# Patient Record
Sex: Female | Born: 1995 | Race: Black or African American | Hispanic: No | Smoking: Never smoker
Health system: Southern US, Community
[De-identification: ages and names within clinical notes are randomized; demographics above are authoritative.]

## PROBLEM LIST (undated history)

## (undated) DIAGNOSIS — K219 Gastro-esophageal reflux disease without esophagitis: Secondary | ICD-10-CM

## (undated) DIAGNOSIS — I10 Essential (primary) hypertension: Secondary | ICD-10-CM

## (undated) HISTORY — PX: NO PAST SURGERIES: SHX2092

---

## 2020-02-23 ENCOUNTER — Emergency Department (HOSPITAL_COMMUNITY): Payer: Medicaid Other

## 2020-02-23 ENCOUNTER — Emergency Department (HOSPITAL_COMMUNITY)
Admission: EM | Admit: 2020-02-23 | Discharge: 2020-02-24 | Disposition: A | Discharge status: Home / Self Care | Payer: Medicaid Other | Attending: Emergency Medicine | Admitting: Emergency Medicine

## 2020-02-23 DIAGNOSIS — Z23 Encounter for immunization: Secondary | ICD-10-CM | POA: Insufficient documentation

## 2020-02-23 DIAGNOSIS — Y9289 Other specified places as the place of occurrence of the external cause: Secondary | ICD-10-CM | POA: Insufficient documentation

## 2020-02-23 DIAGNOSIS — S80212A Abrasion, left knee, initial encounter: Secondary | ICD-10-CM | POA: Insufficient documentation

## 2020-02-23 DIAGNOSIS — S20219A Contusion of unspecified front wall of thorax, initial encounter: Secondary | ICD-10-CM | POA: Insufficient documentation

## 2020-02-23 DIAGNOSIS — M549 Dorsalgia, unspecified: Secondary | ICD-10-CM

## 2020-02-23 DIAGNOSIS — Y939 Activity, unspecified: Secondary | ICD-10-CM | POA: Insufficient documentation

## 2020-02-23 DIAGNOSIS — Y998 Other external cause status: Secondary | ICD-10-CM | POA: Insufficient documentation

## 2020-02-23 DIAGNOSIS — S40812A Abrasion of left upper arm, initial encounter: Secondary | ICD-10-CM | POA: Insufficient documentation

## 2020-02-23 DIAGNOSIS — S299XXA Unspecified injury of thorax, initial encounter: Secondary | ICD-10-CM | POA: Diagnosis present

## 2020-02-23 DIAGNOSIS — R519 Headache, unspecified: Secondary | ICD-10-CM | POA: Insufficient documentation

## 2020-02-23 DIAGNOSIS — S0081XA Abrasion of other part of head, initial encounter: Secondary | ICD-10-CM | POA: Insufficient documentation

## 2020-02-23 DIAGNOSIS — S3210XA Unspecified fracture of sacrum, initial encounter for closed fracture: Secondary | ICD-10-CM | POA: Diagnosis not present

## 2020-02-23 LAB — CBC
HCT: 38.2 % (ref 36.0–46.0)
Hemoglobin: 12.2 g/dL (ref 12.0–15.0)
MCH: 26.4 pg (ref 26.0–34.0)
MCHC: 31.9 g/dL (ref 30.0–36.0)
MCV: 82.7 fL (ref 80.0–100.0)
Platelets: 269 10*3/uL (ref 150–400)
RBC: 4.62 MIL/uL (ref 3.87–5.11)
RDW: 13.4 % (ref 11.5–15.5)
WBC: 9.1 10*3/uL (ref 4.0–10.5)
nRBC: 0 % (ref 0.0–0.2)

## 2020-02-23 LAB — COMPREHENSIVE METABOLIC PANEL
ALT: 28 U/L (ref 0–44)
AST: 29 U/L (ref 15–41)
Albumin: 4.3 g/dL (ref 3.5–5.0)
Alkaline Phosphatase: 37 U/L — ABNORMAL LOW (ref 38–126)
Anion gap: 12 (ref 5–15)
BUN: 16 mg/dL (ref 6–20)
CO2: 24 mmol/L (ref 22–32)
Calcium: 9.5 mg/dL (ref 8.9–10.3)
Chloride: 103 mmol/L (ref 98–111)
Creatinine, Ser: 0.68 mg/dL (ref 0.44–1.00)
GFR calc Af Amer: >60 mL/min (ref >60)
GFR calc non Af Amer: >60 mL/min (ref >60)
Glucose, Bld: 103 mg/dL — ABNORMAL HIGH (ref 70–99)
Potassium: 3.9 mmol/L (ref 3.5–5.1)
Sodium: 139 mmol/L (ref 135–145)
Total Bilirubin: 0.9 mg/dL (ref 0.3–1.2)
Total Protein: 7.7 g/dL (ref 6.5–8.1)

## 2020-02-23 LAB — URINALYSIS, ROUTINE W REFLEX MICROSCOPIC
Bacteria, UA: NONE SEEN
Bilirubin Urine: NEGATIVE
Glucose, UA: NEGATIVE mg/dL
Ketones, ur: NEGATIVE mg/dL
Leukocytes,Ua: NEGATIVE
Nitrite: NEGATIVE
Protein, ur: NEGATIVE mg/dL
RBC / HPF: >50 RBC/hpf — ABNORMAL HIGH (ref 0–5)
Specific Gravity, Urine: 1.03 (ref 1.005–1.030)
pH: 6 (ref 5.0–8.0)

## 2020-02-23 LAB — I-STAT CHEM 8, ED
BUN: 12 mg/dL (ref 6–20)
Calcium, Ion: 1.18 mmol/L (ref 1.15–1.40)
Chloride: 104 mmol/L (ref 98–111)
Creatinine, Ser: 0.6 mg/dL (ref 0.44–1.00)
Glucose, Bld: 88 mg/dL (ref 70–99)
HCT: 36 % (ref 36.0–46.0)
Hemoglobin: 12.2 g/dL (ref 12.0–15.0)
Potassium: 4 mmol/L (ref 3.5–5.1)
Sodium: 141 mmol/L (ref 135–145)
TCO2: 25 mmol/L (ref 22–32)

## 2020-02-23 LAB — I-STAT BETA HCG BLOOD, ED (MC, WL, AP ONLY): I-stat hCG, quantitative: <5 m[IU]/mL (ref <5)

## 2020-02-23 LAB — ETHANOL: Alcohol, Ethyl (B): <10 mg/dL (ref <10)

## 2020-02-23 LAB — LACTIC ACID, PLASMA: Lactic Acid, Venous: 3.1 mmol/L (ref 0.5–1.9)

## 2020-02-23 MED ORDER — IBUPROFEN 600 MG PO TABS: 600.0000 mg | ORAL_TABLET | Freq: Four times a day (QID) | ORAL | 0 refills | Status: DC | PRN

## 2020-02-23 MED ORDER — FENTANYL CITRATE (PF) 100 MCG/2ML IJ SOLN
INTRAMUSCULAR | Status: AC | PRN
  Administered 2020-02-23: 75 ug via INTRAVENOUS

## 2020-02-23 MED ORDER — SODIUM CHLORIDE 0.9 % IV BOLUS
1000.0000 mL | Freq: Once | INTRAVENOUS | Status: AC
  Administered 2020-02-23: 1000 mL via INTRAVENOUS

## 2020-02-23 MED ORDER — TETANUS-DIPHTH-ACELL PERTUSSIS 5-2.5-18.5 LF-MCG/0.5 IM SUSP
0.5000 mL | Freq: Once | INTRAMUSCULAR | Status: AC
  Administered 2020-02-24: 0.5 mL via INTRAMUSCULAR
  Filled 2020-02-23: qty 0.5

## 2020-02-23 MED ORDER — IOHEXOL 300 MG/ML  SOLN
100.0000 mL | Freq: Once | INTRAMUSCULAR | Status: AC | PRN
  Administered 2020-02-23: 100 mL via INTRAVENOUS

## 2020-02-23 MED ORDER — CYCLOBENZAPRINE HCL 10 MG PO TABS: 10.0000 mg | ORAL_TABLET | Freq: Every day | ORAL | 0 refills | Status: AC

## 2020-02-23 MED ORDER — OXYCODONE HCL 5 MG PO TABS: 5.0000 mg | ORAL_TABLET | Freq: Four times a day (QID) | ORAL | 0 refills | Status: DC | PRN

## 2020-02-23 MED ORDER — ACETAMINOPHEN 325 MG PO TABS: 650.0000 mg | ORAL_TABLET | Freq: Four times a day (QID) | ORAL | 0 refills | Status: AC | PRN

## 2020-02-23 MED ORDER — FENTANYL CITRATE (PF) 100 MCG/2ML IJ SOLN
INTRAMUSCULAR | Status: AC
  Filled 2020-02-23: qty 2
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
## 2020-02-24 MED ORDER — HYDROCODONE-ACETAMINOPHEN 5-325 MG PO TABS
1.0000 | ORAL_TABLET | Freq: Once | ORAL | Status: AC
  Administered 2020-02-24: 1 via ORAL
  Filled 2020-02-24: qty 1

## 2020-02-24 NOTE — ED Notes (Signed)
Dr. Jena Gauss transferred to Dr. Eudelia Bunch per his request

## 2020-02-24 NOTE — ED Notes (Signed)
Patient and father verbalized understanding of DC instructions and follow up care with ortho

## 2020-03-06 ENCOUNTER — Emergency Department
Admission: EM | Admit: 2020-03-06 | Discharge: 2020-03-06 | Disposition: A | Discharge status: Home / Self Care | Payer: Medicaid Other | Attending: Emergency Medicine | Admitting: Emergency Medicine

## 2020-03-06 ENCOUNTER — Other Ambulatory Visit: Payer: Self-pay

## 2020-03-06 ENCOUNTER — Encounter: Payer: Self-pay | Admitting: Emergency Medicine

## 2020-03-06 DIAGNOSIS — S01511A Laceration without foreign body of lip, initial encounter: Secondary | ICD-10-CM | POA: Diagnosis not present

## 2020-03-06 DIAGNOSIS — S3210XD Unspecified fracture of sacrum, subsequent encounter for fracture with routine healing: Secondary | ICD-10-CM | POA: Diagnosis not present

## 2020-03-06 DIAGNOSIS — Y939 Activity, unspecified: Secondary | ICD-10-CM | POA: Diagnosis not present

## 2020-03-06 DIAGNOSIS — S42002D Fracture of unspecified part of left clavicle, subsequent encounter for fracture with routine healing: Secondary | ICD-10-CM | POA: Diagnosis not present

## 2020-03-06 DIAGNOSIS — Z79899 Other long term (current) drug therapy: Secondary | ICD-10-CM | POA: Diagnosis not present

## 2020-03-06 DIAGNOSIS — S00501A Unspecified superficial injury of lip, initial encounter: Secondary | ICD-10-CM | POA: Diagnosis present

## 2020-03-06 DIAGNOSIS — S8012XD Contusion of left lower leg, subsequent encounter: Secondary | ICD-10-CM | POA: Diagnosis not present

## 2020-03-06 DIAGNOSIS — S01512A Laceration without foreign body of oral cavity, initial encounter: Secondary | ICD-10-CM | POA: Diagnosis not present

## 2020-03-06 DIAGNOSIS — Y92410 Unspecified street and highway as the place of occurrence of the external cause: Secondary | ICD-10-CM | POA: Insufficient documentation

## 2020-03-06 DIAGNOSIS — T07XXXA Unspecified multiple injuries, initial encounter: Secondary | ICD-10-CM

## 2020-03-06 DIAGNOSIS — Y999 Unspecified external cause status: Secondary | ICD-10-CM | POA: Diagnosis not present

## 2020-03-06 NOTE — ED Notes (Signed)
See triage note - sore left side tongue from bite
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
## 2020-03-28 ENCOUNTER — Other Ambulatory Visit: Payer: Self-pay | Admitting: Otolaryngology

## 2020-03-30 LAB — SURGICAL PATHOLOGY

## 2020-06-19 ENCOUNTER — Encounter: Payer: Self-pay | Admitting: Emergency Medicine

## 2020-06-19 ENCOUNTER — Other Ambulatory Visit: Payer: Self-pay

## 2020-06-19 ENCOUNTER — Emergency Department
Admission: EM | Admit: 2020-06-19 | Discharge: 2020-06-19 | Disposition: A | Discharge status: Home / Self Care | Payer: Medicaid Other | Source: Home / Self Care | Attending: Emergency Medicine | Admitting: Emergency Medicine

## 2020-06-19 ENCOUNTER — Emergency Department: Payer: Medicaid Other

## 2020-06-19 ENCOUNTER — Emergency Department
Admission: EM | Admit: 2020-06-19 | Discharge: 2020-06-19 | Disposition: A | Discharge status: Home / Self Care | Payer: Medicaid Other | Attending: Emergency Medicine | Admitting: Emergency Medicine

## 2020-06-19 DIAGNOSIS — O200 Threatened abortion: Secondary | ICD-10-CM | POA: Insufficient documentation

## 2020-06-19 DIAGNOSIS — O26851 Spotting complicating pregnancy, first trimester: Secondary | ICD-10-CM | POA: Diagnosis present

## 2020-06-19 DIAGNOSIS — O4690 Antepartum hemorrhage, unspecified, unspecified trimester: Secondary | ICD-10-CM

## 2020-06-19 DIAGNOSIS — Z3A00 Weeks of gestation of pregnancy not specified: Secondary | ICD-10-CM | POA: Insufficient documentation

## 2020-06-19 DIAGNOSIS — O209 Hemorrhage in early pregnancy, unspecified: Secondary | ICD-10-CM

## 2020-06-19 DIAGNOSIS — Z3A01 Less than 8 weeks gestation of pregnancy: Secondary | ICD-10-CM | POA: Diagnosis not present

## 2020-06-19 LAB — ABO/RH: ABO/RH(D): AB POS

## 2020-06-19 LAB — WET PREP, GENITAL
Clue Cells Wet Prep HPF POC: NONE SEEN
Sperm: NONE SEEN
Trich, Wet Prep: NONE SEEN
Yeast Wet Prep HPF POC: NONE SEEN

## 2020-06-19 LAB — BASIC METABOLIC PANEL
Anion gap: 8 (ref 5–15)
BUN: 9 mg/dL (ref 6–20)
CO2: 27 mmol/L (ref 22–32)
Calcium: 9.4 mg/dL (ref 8.9–10.3)
Chloride: 104 mmol/L (ref 98–111)
Creatinine, Ser: 0.5 mg/dL (ref 0.44–1.00)
GFR, Estimated: >60 mL/min (ref >60)
Glucose, Bld: 98 mg/dL (ref 70–99)
Potassium: 3.6 mmol/L (ref 3.5–5.1)
Sodium: 139 mmol/L (ref 135–145)

## 2020-06-19 LAB — CBC
HCT: 32.3 % — ABNORMAL LOW (ref 36.0–46.0)
HCT: 33.3 % — ABNORMAL LOW (ref 36.0–46.0)
Hemoglobin: 11 g/dL — ABNORMAL LOW (ref 12.0–15.0)
Hemoglobin: 11.2 g/dL — ABNORMAL LOW (ref 12.0–15.0)
MCH: 27.5 pg (ref 26.0–34.0)
MCH: 27.1 pg (ref 26.0–34.0)
MCHC: 34.1 g/dL (ref 30.0–36.0)
MCHC: 33.6 g/dL (ref 30.0–36.0)
MCV: 80.8 fL (ref 80.0–100.0)
MCV: 80.6 fL (ref 80.0–100.0)
Platelets: 257 10*3/uL (ref 150–400)
Platelets: 298 10*3/uL (ref 150–400)
RBC: 4 MIL/uL (ref 3.87–5.11)
RBC: 4.13 MIL/uL (ref 3.87–5.11)
RDW: 12.7 % (ref 11.5–15.5)
RDW: 12.6 % (ref 11.5–15.5)
WBC: 5.5 10*3/uL (ref 4.0–10.5)
WBC: 7 10*3/uL (ref 4.0–10.5)
nRBC: 0 % (ref 0.0–0.2)
nRBC: 0 % (ref 0.0–0.2)

## 2020-06-19 LAB — CHLAMYDIA/NGC RT PCR (ARMC ONLY)
Chlamydia Tr: NOT DETECTED
N gonorrhoeae: NOT DETECTED

## 2020-06-19 LAB — POC URINE PREG, ED: Preg Test, Ur: POSITIVE — AB

## 2020-06-19 LAB — HCG, QUANTITATIVE, PREGNANCY: hCG, Beta Chain, Quant, S: 226 m[IU]/mL — ABNORMAL HIGH (ref <5)

## 2020-06-19 NOTE — ED Triage Notes (Signed)
Pt was seen here this am for vag bleeding, was told unsure of pregnancy as [place] stated too early and recommended repeat. Pt here for worsening bleeding.

## 2020-06-19 NOTE — Discharge Instructions (Signed)
Please follow-up as scheduled at [facility] clinic OB/GYN.  Return to the emergency department if you develop new or worsening symptoms that concern you.

## 2020-06-19 NOTE — ED Triage Notes (Addendum)
Pt via POV from home. Pt c/o vaginal bleeding since this AM, states it is bright red blood. Denies pain. Pt is pregnant but unknown of how far along she is. Just got a pregnancy test on the 16th. Pt is A&Ox4 and NAD. Unknown of her first day of last menstrual period. Pt states she did get STD, was dx and treated for Chlamydia and Trichomoniasis

## 2020-06-19 NOTE — ED Provider Notes (Signed)
[facility] Emergency Department Provider Note  ____________________________________________   Event Date/Time   First MD Initiated Contact with Patient 06/19/20 2309     (approximate)  I have reviewed the triage vital signs and the nursing notes.   HISTORY  Chief Complaint Vaginal Bleeding    HPI [name] is a 24 y.o. female G1, P0 who just found out she was pregnant and was seen earlier today for vaginal bleeding. She presents tonight because her vaginal bleeding is gotten little bit worse. She said the bite tonight she feels that it is about the same as a usual menstrual period. She is having no cramping. She denies fever/chills, sore throat, chest pain, shortness of breath, nausea, vomiting, any abdominal pain, and dysuria. She is just concerned because it is her 1st pregnancy and the ultrasound earlier tonight could not tell her if it is her early pregnancy or a miscarriage. She has follow-up scheduled at [facility] clinic OB/GYN. She says she was a scared because of the increase in bleeding. She describes it as severe nothing in particular makes it better or worse. She has had no lightheadedness nor dizziness and does not feel like she is going to pass out.         No past medical history on file.  There are no problems to display for this patient.   No past surgical history on file.  Prior to Admission medications   Medication Sig Start Date End Date Taking? Authorizing Provider  ibuprofen (ADVIL) 600 MG tablet Take 1 tablet (600 mg total) by mouth every 6 (six) hours as needed for up to 30 doses for mild pain or moderate pain. 02/23/20   Terald Sleeper, MD  oxyCODONE (ROXICODONE) 5 MG immediate release tablet Take 1 tablet (5 mg total) by mouth every 6 (six) hours as needed for up to 5 doses for severe pain. 02/23/20   Terald Sleeper, MD    Allergies Patient has no known allergies.  No family history on file.  Social  History Social History   Tobacco Use  . Smoking status: Never Smoker    Review of Systems Constitutional: No fever/chills Eyes: No visual changes. ENT: No sore throat. Cardiovascular: Denies chest pain. Respiratory: Denies shortness of breath. Gastrointestinal: No abdominal pain.  No nausea, no vomiting.  No diarrhea.  No constipation. Genitourinary: Vaginal bleeding in early pregnancy. Musculoskeletal: Negative for neck pain.  Negative for back pain. Integumentary: Negative for rash. Neurological: Negative for headaches, focal weakness or numbness.   ____________________________________________   PHYSICAL EXAM:  VITAL SIGNS: ED Triage Vitals  Enc Vitals Group     BP 06/19/20 2058 (!) 158/84     Pulse Rate 06/19/20 2058 87     Resp 06/19/20 2058 20     Temp 06/19/20 2058 98.9 F (37.2 C)     Temp Source 06/19/20 2058 Oral     SpO2 06/19/20 2058 98 %     Weight 06/19/20 2059 77.1 kg (170 lb)     Height 06/19/20 2059 1.676 m (5\' 6" )     Head Circumference --      Peak Flow --      Pain Score 06/19/20 2059 0     Pain Loc --      Pain Edu? --      Excl. in GC? --     Constitutional: Alert and oriented.  Eyes: Conjunctivae are normal.  Head: Atraumatic. Nose: No congestion/rhinnorhea. Mouth/Throat: Patient is wearing a mask.  Neck: No stridor.  No meningeal signs.   Cardiovascular: Normal rate, regular rhythm. Good peripheral circulation. Respiratory: Normal respiratory effort.  No retractions. Gastrointestinal: Soft and nontender. No distention.  Genitourinary: Deferred after discussing with the patient and her preference is to hold off on a repeat pelvic exam. Musculoskeletal: No lower extremity tenderness nor edema. No gross deformities of extremities. Neurologic:  Normal speech and language. No gross focal neurologic deficits are appreciated.  Skin:  Skin is warm, dry and intact. Psychiatric: Mood and affect are anxious but generally appropriate under the  circumstances.  ____________________________________________   LABS (all labs ordered are listed, but only abnormal results are displayed)  Labs Reviewed  CBC - Abnormal; Notable for the following components:      Result Value   Hemoglobin 11.2 (*)    HCT 33.3 (*)    All other components within normal limits   ____________________________________________  EKG  No indication for emergent EKG ____________________________________________  RADIOLOGY I, Loleta Rose, personally viewed and evaluated these images (plain radiographs) as part of my medical decision making, as well as reviewing the written report by the radiologist.  ED MD interpretation: Ultrasound was obtained earlier today and demonstrates no definite intrauterine pregnancy. No repeat imaging tonight.  Official radiology report(s): US OB LESS THAN 14 WEEKS WITH OB TRANSVAGINAL  Result Date: 06/19/2020 CLINICAL DATA:  Vaginal bleeding EXAM: OBSTETRIC <14 WK [place] AND TRANSVAGINAL OB US TECHNIQUE: Both transabdominal and transvaginal ultrasound examinations were performed for complete evaluation of the gestation as well as the maternal uterus, adnexal regions, and pelvic cul-de-sac. Transvaginal technique was performed to assess early pregnancy. COMPARISON:  None. FINDINGS: Intrauterine gestational sac: None Yolk sac:  Not Visualized. Embryo:  Not Visualized. Maternal uterus/adnexae: Nonspecific 4 mm hypoechoic area within the fundal endometrium. Adnexae are unremarkable. No free fluid. IMPRESSION: No definite intrauterine pregnancy. Nonspecific 4 mm hypoechoic area within the fundal endometrium could reflect an early or abnormal gestational sac. Follow-up ultrasound and quantitative B-HCG levels suggested. Electronically Signed   By: Guadlupe Spanish M.D.   On: 06/19/2020 13:13    ____________________________________________   PROCEDURES   Procedure(s) performed (including Critical  Care):  Procedures   ____________________________________________   INITIAL IMPRESSION / MDM / ASSESSMENT AND PLAN / ED COURSE  As part of my medical decision making, I reviewed the following data within the electronic MEDICAL RECORD NUMBER Nursing notes reviewed and incorporated, Labs reviewed , Old chart reviewed and Notes from prior ED visits   Differential diagnosis includes, but is not limited to, threatened miscarriage, incomplete miscarriage, normal bleeding from an early trimester pregnancy, ectopic pregnancy, , blighted ovum, vaginal/cervical trauma, subchorionic hemorrhage/hematoma, etc.  The patient is hemodynamically stable. Other than being anxious and concerned about the vaginal bleeding, she has no complaints or concerns. Her hemoglobin is stable from prior. The amount of bleeding she is experiencing is similar to her normal menstrual cycle.  I had a conversation with the patient and explained that this is most likely to be expected for a miscarriage. She is in no distress and does not require emergent OB/GYN consultation. After talking with me about expectations, she feels more comfortable and will follow up as an outpatient. I gave my usual and customary return precautions should she experience new or worsening symptoms. She is comfortable with the plan for discharge and deferring an additional pelvic exam at this time.            ____________________________________________  FINAL CLINICAL IMPRESSION(S) / ED DIAGNOSES  Final  diagnoses:  Threatened miscarriage     MEDICATIONS GIVEN DURING THIS VISIT:  Medications - No data to display   ED Discharge Orders    None      *Please note:  Giovana Faciane was evaluated in Emergency Department on 06/19/2020 for the symptoms described in the history of present illness. She was evaluated in the context of the global COVID-19 pandemic, which necessitated consideration that the patient might be at risk for infection  with the SARS-CoV-2 virus that causes COVID-19. Institutional protocols and algorithms that pertain to the evaluation of patients at risk for COVID-19 are in a state of rapid change based on information released by regulatory bodies including the CDC and federal and state organizations. These policies and algorithms were followed during the patient's care in the ED.  Some ED evaluations and interventions may be delayed as a result of limited staffing during and after the pandemic.*  Note:  This document was prepared using Dragon voice recognition software and may include unintentional dictation errors.   Loleta Rose, MD 06/19/20 [phone]

## 2020-06-19 NOTE — ED Provider Notes (Signed)
[facility] Emergency Department Provider Note   ____________________________________________   Event Date/Time   First MD Initiated Contact with Patient 06/19/20 1318     (approximate)  I have reviewed the triage vital signs and the nursing notes.   HISTORY  Chief Complaint Vaginal Bleeding    HPI [name] is a 24 y.o. female, G1P0 at approximately 5 weeks of pregnancy who presents to the ED complaining of vaginal bleeding.  Patient reports that she found out she was pregnant 11 days ago and has an OB/GYN appointment scheduled for this coming week.  This morning, she noticed vaginal bleeding as well as dark brownish discharge.  She denies any associated abdominal pain and has not had any nausea or vomiting.  She is unsure of her exact LMP but thinks it was about 5 weeks ago.  She has not had any dysuria or hematuria.        No past medical history on file.  There are no problems to display for this patient.   No past surgical history on file.  Prior to Admission medications   Medication Sig Start Date End Date Taking? Authorizing Provider  ibuprofen (ADVIL) 600 MG tablet Take 1 tablet (600 mg total) by mouth every 6 (six) hours as needed for up to 30 doses for mild pain or moderate pain. 02/23/20   Terald Sleeper, MD  oxyCODONE (ROXICODONE) 5 MG immediate release tablet Take 1 tablet (5 mg total) by mouth every 6 (six) hours as needed for up to 5 doses for severe pain. 02/23/20   Terald Sleeper, MD    Allergies Patient has no known allergies.  No family history on file.  Social History Social History   Tobacco Use  . Smoking status: Never Smoker    Review of Systems  Constitutional: No fever/chills Eyes: No visual changes. ENT: No sore throat. Cardiovascular: Denies chest pain. Respiratory: Denies shortness of breath. Gastrointestinal: No abdominal pain.  No nausea, no vomiting.  No diarrhea.  No  constipation. Genitourinary: Negative for dysuria.  Positive for vaginal bleeding. Musculoskeletal: Negative for back pain. Skin: Negative for rash. Neurological: Negative for headaches, focal weakness or numbness.  ____________________________________________   PHYSICAL EXAM:  VITAL SIGNS: ED Triage Vitals [06/19/20 1118]  Enc Vitals Group     BP 137/82     Pulse Rate 69     Resp 20     Temp 98.2 F (36.8 C)     Temp Source Oral     SpO2 100 %     Weight 196 lb (88.9 kg)     Height 5\' 6"  (1.676 m)     Head Circumference      Peak Flow      Pain Score 0     Pain Loc      Pain Edu?      Excl. in GC?     Constitutional: Alert and oriented. Eyes: Conjunctivae are normal. Head: Atraumatic. Nose: No congestion/rhinnorhea. Mouth/Throat: Mucous membranes are moist. Neck: Normal ROM Cardiovascular: Normal rate, regular rhythm. Grossly normal heart sounds. Respiratory: Normal respiratory effort.  No retractions. Lungs CTAB. Gastrointestinal: Soft and nontender. No distention. Genitourinary: Cervical os closed with small amount of blood in vaginal vault, no tissue noted. Musculoskeletal: No lower extremity tenderness nor edema. Neurologic:  Normal speech and language. No gross focal neurologic deficits are appreciated. Skin:  Skin is warm, dry and intact. No rash noted. Psychiatric: Mood and affect are normal. Speech and behavior are  normal.  ____________________________________________   LABS (all labs ordered are listed, but only abnormal results are displayed)  Labs Reviewed  WET PREP, GENITAL - Abnormal; Notable for the following components:      Result Value   WBC, Wet Prep HPF POC MODERATE (*)    All other components within normal limits  CBC - Abnormal; Notable for the following components:   Hemoglobin 11.0 (*)    HCT 32.3 (*)    All other components within normal limits  HCG, QUANTITATIVE, PREGNANCY - Abnormal; Notable for the following components:   hCG,  Beta Chain, Quant, S 226 (*)    All other components within normal limits  POC URINE PREG, ED - Abnormal; Notable for the following components:   Preg Test, Ur POSITIVE (*)    All other components within normal limits  CHLAMYDIA/NGC RT PCR ([facility] ONLY)  BASIC METABOLIC PANEL  ABO/RH    PROCEDURES  Procedure(s) performed (including Critical Care):  Procedures   ____________________________________________   INITIAL IMPRESSION / ASSESSMENT AND PLAN / ED COURSE       24 year old female, G1, P0 at approximately 5 weeks of pregnancy, presents to the ED with vaginal bleeding and dark brownish discharge starting this morning.  Pelvic exam shows closed cervical os with small amount of blood but no tissue noted.  She has no associate abdominal tenderness and labs are reassuring, type and Rh as well as beta hCG are pending.  Ultrasound does show no definite intrauterine pregnancy but nonspecific hypoechoic area in the fundal endometrium that could represent early or abnormal gestational sac.  Patient has OB/GYN follow-up scheduled later this week, where she may have follow-up ultrasound and beta hCG.  Patient is Rh+, no indication for RhoGam.  She is appropriate for discharge home with OB/GYN follow-up, was counseled to return to the ED for new worsening symptoms.  Patient agrees with plan.      ____________________________________________   FINAL CLINICAL IMPRESSION(S) / ED DIAGNOSES  Final diagnoses:  Vaginal bleeding in pregnancy     ED Discharge Orders    None       Note:  This document was prepared using Dragon voice recognition software and may include unintentional dictation errors.   Chesley Noon, MD 06/19/20 [phone]

## 2020-07-23 ENCOUNTER — Telehealth: Payer: Self-pay | Admitting: General Practice

## 2020-07-23 NOTE — Telephone Encounter (Signed)
Patient returning call. Advised patient return call might come through as a blocked or  unrecognizable # and to please keep phone near, patient voice understanding.

## 2020-07-23 NOTE — Telephone Encounter (Signed)
Called pt, left VM will call back number.

## 2020-07-23 NOTE — Telephone Encounter (Signed)
Pt advised:  -If you have mild symptoms that are resolving or have resolved, isolate at home for 5 days since symptoms started AND continue to wear a well-fitted mask when around others in the home and in public for 5 additional days after isolation is completed -If you have a fever and/or moderate to severe symptoms, isolate for at least 10 days since the symptoms started AND until you are fever free for at least 24 hours without the use of fever-reducing medications -If you tested positive and did not have symptoms, isolate for at least 5 days after your positive test  Use over-the-counter medications for symptoms.If you develop respiratory issues/distress, seek medical care in the Emergency Department.  If you must leave home or if you have to be around others please wear a mask. Please limit contact with immediate family members in the home, practice social distancing, frequent handwashing and clean hard surfaces touched frequently with household cleaning products. Members of your household will also need to quarantine and test.You may also be contacted by the health department for follow up.  Advised pt to call PCP or go to [facility] if phlegm is yellow, brown or green. Pt verbalized understanding.

## 2020-07-23 NOTE — Telephone Encounter (Signed)
Patient took at home COVID test results reflect positive, patient feels fatigue, no other symptoms. Patient seeking clinical advice and states her PCP office is closed today.

## 2020-09-04 DIAGNOSIS — Z3480 Encounter for supervision of other normal pregnancy, unspecified trimester: Secondary | ICD-10-CM | POA: Insufficient documentation

## 2020-09-04 DIAGNOSIS — Z3490 Encounter for supervision of normal pregnancy, unspecified, unspecified trimester: Secondary | ICD-10-CM | POA: Insufficient documentation

## 2020-09-04 HISTORY — DX: Encounter for supervision of other normal pregnancy, unspecified trimester: Z34.80

## 2020-09-04 HISTORY — DX: Encounter for supervision of normal pregnancy, unspecified, unspecified trimester: Z34.90

## 2020-09-20 ENCOUNTER — Encounter (HOSPITAL_COMMUNITY): Payer: Self-pay

## 2020-09-20 ENCOUNTER — Other Ambulatory Visit: Payer: Self-pay

## 2020-09-20 DIAGNOSIS — R079 Chest pain, unspecified: Secondary | ICD-10-CM | POA: Insufficient documentation

## 2020-09-20 DIAGNOSIS — Z3A12 12 weeks gestation of pregnancy: Secondary | ICD-10-CM | POA: Diagnosis not present

## 2020-09-20 DIAGNOSIS — O26891 Other specified pregnancy related conditions, first trimester: Secondary | ICD-10-CM | POA: Diagnosis present

## 2020-09-20 LAB — CBC
HCT: 30.2 % — ABNORMAL LOW (ref 36.0–46.0)
Hemoglobin: 10.4 g/dL — ABNORMAL LOW (ref 12.0–15.0)
MCH: 28 pg (ref 26.0–34.0)
MCHC: 34.4 g/dL (ref 30.0–36.0)
MCV: 81.2 fL (ref 80.0–100.0)
Platelets: 208 10*3/uL (ref 150–400)
RBC: 3.72 MIL/uL — ABNORMAL LOW (ref 3.87–5.11)
RDW: 15.1 % (ref 11.5–15.5)
WBC: 9.5 10*3/uL (ref 4.0–10.5)
nRBC: 0 % (ref 0.0–0.2)

## 2020-09-20 NOTE — ED Triage Notes (Signed)
Patient arrived stating she has had intermittent pain on the right side of the chest today, states her knew job has mold and wants to be checked. Declines any NVD.

## 2020-09-21 ENCOUNTER — Encounter (HOSPITAL_COMMUNITY): Payer: Self-pay | Admitting: Student

## 2020-09-21 ENCOUNTER — Emergency Department (HOSPITAL_COMMUNITY)
Admission: EM | Admit: 2020-09-21 | Discharge: 2020-09-21 | Disposition: A | Discharge status: Home / Self Care | Payer: Medicaid Other | Attending: Emergency Medicine | Admitting: Emergency Medicine

## 2020-09-21 ENCOUNTER — Emergency Department (HOSPITAL_COMMUNITY): Payer: Medicaid Other

## 2020-09-21 DIAGNOSIS — R079 Chest pain, unspecified: Secondary | ICD-10-CM

## 2020-09-21 HISTORY — DX: Gastro-esophageal reflux disease without esophagitis: K21.9

## 2020-09-21 LAB — BASIC METABOLIC PANEL
Anion gap: 8 (ref 5–15)
BUN: 10 mg/dL (ref 6–20)
CO2: 21 mmol/L — ABNORMAL LOW (ref 22–32)
Calcium: 9.3 mg/dL (ref 8.9–10.3)
Chloride: 107 mmol/L (ref 98–111)
Creatinine, Ser: 0.51 mg/dL (ref 0.44–1.00)
GFR, Estimated: >60 mL/min (ref >60)
Glucose, Bld: 105 mg/dL — ABNORMAL HIGH (ref 70–99)
Potassium: 3.8 mmol/L (ref 3.5–5.1)
Sodium: 136 mmol/L (ref 135–145)

## 2020-09-21 LAB — TROPONIN I (HIGH SENSITIVITY)
Troponin I (High Sensitivity): <2 ng/L (ref <18)
Troponin I (High Sensitivity): <2 ng/L (ref <18)

## 2020-09-21 NOTE — Discharge Instructions (Addendum)
You were seen in the emergency department today for chest pain. Your work-up in the emergency department has been overall reassuring. Your labs have been fairly normal and or similar to previous blood work you have had done. Your EKG and the enzyme we use to check your heart did not show an acute heart attack at this time. Your chest x-ray was normal.   Please take Tylenol per over-the-counter dosing to help with discomfort.  You may also use nasal saline spray to help with congestion.  We would like you to follow up closely with your primary care provider within 3 days.  Please also follow-up with your OB/GYN as scheduled.. Return to the ER immediately should you experience any new or worsening symptoms including but not limited to return of pain, worsened pain, vomiting, shortness of breath, coughing up blood, pain with deep breathing, dizziness, lightheadedness, passing out, or any other concerns that you may have.

## 2020-09-21 NOTE — ED Provider Notes (Signed)
[facility]-EMERGENCY DEPT Provider Note   CSN: [id number] Arrival date & time: 09/20/20  2254     History Chief Complaint  Patient presents with  . Chest Pain    [name] is a 25 y.o. female without significant past medical history who is approximately [redacted] weeks pregnant presents to the emergency department with complaints of chest pain today.  Patient states she has had intermittent chest pain to the right side, has had a few episodes, feels sharp in nature and lasts a couple seconds prior to resolving.  No alleviating or aggravating factors.  No change with exertion or deep breath.  No pain at present.  She is unsure if this could have just been reflux.  She also has had some mild nasal congestion.  She thinks she might have been exposed to mold today.  She denies fever, chills, ear pain, sore throat, cough, dyspnea, leg pain/swelling, hemoptysis, recent surgery/trauma, recent long travel,  personal hx of cancer, or hx of DVT/PE.   She is currently approximately [redacted] weeks pregnant, she is a follow-up appointment with her OB today, she has no complaints of abdominal pain/pelvic pain, urinary symptoms, or vaginal bleeding.    HPI     Past Medical History:  Diagnosis Date  . Acid reflux     There are no problems to display for this patient.   No past surgical history on file.   OB History    Gravida  1   Para      Term      Preterm      AB      Living        SAB      IAB      Ectopic      Multiple      Live Births              No family history on file.  Social History   Tobacco Use  . Smoking status: Never Smoker    Home Medications Prior to Admission medications   Medication Sig Start Date End Date Taking? Authorizing Provider  omeprazole (PRILOSEC) 20 MG capsule Take 40 mg by mouth every morning. 08/31/20  Yes [provider]    Allergies    Patient has no known allergies.  Review of Systems    Review of Systems  Constitutional: Negative for chills and fever.  HENT: Positive for congestion. Negative for ear pain and sore throat.   Respiratory: Negative for shortness of breath.   Cardiovascular: Positive for chest pain. Negative for leg swelling.  Gastrointestinal: Negative for abdominal pain, nausea and vomiting.  Genitourinary: Negative for dysuria and vaginal bleeding.  Neurological: Negative for syncope.  All other systems reviewed and are negative.   Physical Exam Updated Vital Signs BP 134/66   Pulse 70   Temp 98.4 F (36.9 C) (Oral)   Resp 17   Ht 5\' 6"  (1.676 m)   Wt 77.1 kg   LMP 02/16/2020 (LMP Unknown)   SpO2 100%   BMI 27.44 kg/m   Physical Exam Vitals and nursing note reviewed.  Constitutional:      General: She is not in acute distress.    Appearance: She is well-developed. She is not toxic-appearing.  HENT:     Head: Normocephalic and atraumatic.  Eyes:     General:        Right eye: No discharge.        Left eye: No discharge.  Conjunctiva/sclera: Conjunctivae normal.  Cardiovascular:     Rate and Rhythm: Normal rate and regular rhythm.     Pulses:          Radial pulses are 2+ on the right side and 2+ on the left side.  Pulmonary:     Effort: Pulmonary effort is normal. No respiratory distress.     Breath sounds: Normal breath sounds. No wheezing, rhonchi or rales.  Chest:     Chest wall: No deformity or tenderness.  Abdominal:     General: There is no distension.     Palpations: Abdomen is soft.     Tenderness: There is no abdominal tenderness.  Musculoskeletal:     Cervical back: Neck supple.     Right lower leg: No tenderness. No edema.     Left lower leg: No tenderness. No edema.  Skin:    General: Skin is warm and Zimmerman.     Findings: No rash.  Neurological:     Mental Status: She is alert.     Comments: Clear speech.   Psychiatric:        Behavior: Behavior normal.    ED Results / Procedures / Treatments   Labs (all  labs ordered are listed, but only abnormal results are displayed) Labs Reviewed  BASIC METABOLIC PANEL - Abnormal; Notable for the following components:      Result Value   CO2 21 (*)    Glucose, Bld 105 (*)    All other components within normal limits  CBC - Abnormal; Notable for the following components:   RBC 3.72 (*)    Hemoglobin 10.4 (*)    HCT 30.2 (*)    All other components within normal limits  TROPONIN I (HIGH SENSITIVITY)  TROPONIN I (HIGH SENSITIVITY)    EKG EKG Interpretation  Date/Time:  Wednesday September 20 2020 23:14:20 EDT Ventricular Rate:  93 PR Interval:  153 QRS Duration: 71 QT Interval:  368 QTC Calculation: 458 R Axis:   78 Text Interpretation: Sinus rhythm 12 Lead; Mason-Likar Confirmed by Nicanor Alcon, April ([id number]) on 09/21/2020 12:41:41 AM   Radiology DG Chest 2 View  Result Date: 09/21/2020 CLINICAL DATA:  Chest pain EXAM: CHEST - 2 VIEW COMPARISON:  02/23/2020 FINDINGS: The heart size and mediastinal contours are within normal limits. Both lungs are clear. The visualized skeletal structures are unremarkable. IMPRESSION: No active cardiopulmonary disease. Electronically Signed   By: Helyn Numbers MD   On: 09/21/2020 01:51    Procedures Procedures   Medications Ordered in ED Medications - No data to display  ED Course  I have reviewed the triage vital signs and the nursing notes.  Pertinent labs & imaging results that were available during my care of the patient were reviewed by me and considered in my medical decision making (see chart for details).    MDM Rules/Calculators/A&P                         Patient presents to the emergency department with chest pain. Patient nontoxic appearing, in no apparent distress, vitals without significant abnormality, initial elevated BP resolved. Fairly benign physical exam.   DDX: ACS, pulmonary embolism, dissection, pneumothorax, pneumonia, arrhythmia, severe anemia, MSK, GERD, anxiety.   Additional  history obtained:  Additional history obtained from chart review & nursing note review.   EKG: Sinus rhythm no STEMI  Lab Tests:  I reviewed and interpreted labs, which included:  CBC: Anemia similar to prior. BMP: No  significant electrolyte derangement. Troponin: Within normal limits/flat  Imaging Studies ordered:  I ordered imaging studies which included CXR, I independently reviewed, formal radiology impression shows:  No active cardiopulmonary disease.  ED Course:  Heart Pathway Score low risk- EKG without obvious acute ischemia, delta troponin negative, doubt ACS. Patient is low risk wells, she is noted to be pregnant, however no pleuritic component, no tachycardia/hypoxia, low suspicion for pulmonary embolism. Pain is not a tearing sensation, symmetric pulses, no widening of mediastinum on CXR, doubt dissection. Cardiac monitor reviewed, no notable arrhythmias or tachycardia. No signs of acute bacterial HEENT infection. Unclear definitive etiology, no pain at present, Overall reassuring workup, no pregnancy complaints, appears appropriate for discharge. I discussed results, treatment plan, need for follow-up, and return precautions with the patient. Provided opportunity for questions, patient confirmed understanding and is in agreement with plan.    Portions of this note were generated with Scientist, clinical (histocompatibility and immunogenetics). Dictation errors may occur despite best attempts at proofreading.  Final Clinical Impression(s) / ED Diagnoses Final diagnoses:  Chest pain, unspecified type    Rx / DC Orders ED Discharge Orders    None       Desmond Lope 09/21/20 0227    Palumbo, April, MD 09/21/20 [phone]

## 2020-09-21 NOTE — ED Notes (Signed)
Pt verbalized understanding of d/c and follow up care. Ambulatory with steady gait.  

## 2020-10-05 ENCOUNTER — Ambulatory Visit: Payer: Medicaid Other

## 2020-10-17 ENCOUNTER — Ambulatory Visit: Payer: Medicaid Other | Attending: Maternal & Fetal Medicine

## 2020-10-17 ENCOUNTER — Telehealth: Payer: Self-pay | Admitting: Obstetrics and Gynecology

## 2020-10-17 ENCOUNTER — Other Ambulatory Visit: Payer: Self-pay

## 2020-10-17 NOTE — Telephone Encounter (Signed)
Left voicemail for [name] to call back to reschedule genetic counseling visit that she missed today.  Also offered the option of telephone counseling if that is more convenient for the patient. 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  We may be reached at [phone].  Cherly Anderson, MS, CGC

## 2020-11-20 ENCOUNTER — Other Ambulatory Visit: Payer: Self-pay

## 2020-11-20 ENCOUNTER — Observation Stay
Admission: EM | Admit: 2020-11-20 | Discharge: 2020-11-20 | Disposition: A | Discharge status: Home / Self Care | Payer: Medicaid Other | Attending: Obstetrics and Gynecology | Admitting: Obstetrics and Gynecology

## 2020-11-20 DIAGNOSIS — B069 Rubella without complication: Secondary | ICD-10-CM | POA: Diagnosis not present

## 2020-11-20 DIAGNOSIS — O99012 Anemia complicating pregnancy, second trimester: Secondary | ICD-10-CM | POA: Diagnosis not present

## 2020-11-20 DIAGNOSIS — D649 Anemia, unspecified: Secondary | ICD-10-CM | POA: Diagnosis not present

## 2020-11-20 DIAGNOSIS — O132 Gestational [pregnancy-induced] hypertension without significant proteinuria, second trimester: Secondary | ICD-10-CM | POA: Diagnosis not present

## 2020-11-20 DIAGNOSIS — O09892 Supervision of other high risk pregnancies, second trimester: Secondary | ICD-10-CM | POA: Insufficient documentation

## 2020-11-20 DIAGNOSIS — Z7982 Long term (current) use of aspirin: Secondary | ICD-10-CM | POA: Insufficient documentation

## 2020-11-20 DIAGNOSIS — D573 Sickle-cell trait: Secondary | ICD-10-CM | POA: Insufficient documentation

## 2020-11-20 DIAGNOSIS — R87612 Low grade squamous intraepithelial lesion on cytologic smear of cervix (LGSIL): Secondary | ICD-10-CM | POA: Insufficient documentation

## 2020-11-20 DIAGNOSIS — Z3A20 20 weeks gestation of pregnancy: Secondary | ICD-10-CM | POA: Insufficient documentation

## 2020-11-20 DIAGNOSIS — O4692 Antepartum hemorrhage, unspecified, second trimester: Secondary | ICD-10-CM

## 2020-11-20 DIAGNOSIS — O26852 Spotting complicating pregnancy, second trimester: Principal | ICD-10-CM | POA: Insufficient documentation

## 2020-11-20 HISTORY — DX: Essential (primary) hypertension: I10

## 2020-11-20 HISTORY — DX: Antepartum hemorrhage, unspecified, second trimester: O46.92

## 2020-11-20 LAB — CHLAMYDIA/NGC RT PCR (ARMC ONLY)
Chlamydia Tr: NOT DETECTED
N gonorrhoeae: NOT DETECTED

## 2020-11-20 LAB — WET PREP, GENITAL
Clue Cells Wet Prep HPF POC: NONE SEEN
Sperm: NONE SEEN
Trich, Wet Prep: NONE SEEN
Yeast Wet Prep HPF POC: NONE SEEN

## 2020-11-20 MED ORDER — HYDROCORTISONE (PERIANAL) 2.5 % EX CREA: 1.0000 "application " | TOPICAL_CREAM | Freq: Three times a day (TID) | CUTANEOUS | 0 refills | Status: DC

## 2020-11-20 MED ORDER — WITCH HAZEL-GLYCERIN EX PADS: 1.0000 "application " | MEDICATED_PAD | CUTANEOUS | 0 refills | Status: DC | PRN

## 2020-11-20 MED ORDER — FLUCONAZOLE 50 MG PO TABS
150.0000 mg | ORAL_TABLET | Freq: Once | ORAL | Status: AC
  Administered 2020-11-20: 150 mg via ORAL
  Filled 2020-11-20: qty 1

## 2020-11-20 MED ORDER — BENZOCAINE-MENTHOL 20-0.5 % EX AERO: 1.0000 "application " | INHALATION_SPRAY | Freq: Four times a day (QID) | CUTANEOUS | 0 refills | Status: DC | PRN

## 2020-11-20 NOTE — OB Triage Note (Addendum)
Pt is a 25y/o G2P0 at [redacted]w[redacted]d with c/o bleeding of uncertain source that began while having a difficult bowel movement. Pt states recent sexual intercourse. Pt states +FM, but not able to notice any since noticed bleeding. Pt denies LOF and CTX. Monitors applied and assessing. Initial FHT 170 via doppler and external monitor.

## 2020-11-20 NOTE — Progress Notes (Signed)
Pt to be d/c home and to self care. Pt given teaching on when to seek medical attention (LOF, VB and decreased FM, etc..). Pt given instructions on sitz bath(sent home with pt). Pt given instructions about hemorrhoids and treatment plan. Pt verbalized understanding of d/c instructions.
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
## 2020-11-20 NOTE — Discharge Instructions (Signed)
Use Anusol as directed, prescription sent to pharmacy. Also use Dermoplast spray, Tucks (witch hazel pads) as needed. Sitz bath with epsom salts 3x daily for at least 10-34min per soak.   Stool softeners: take Colace twice daily to prevent constipation.

## 2020-12-08 ENCOUNTER — Observation Stay: Payer: Medicaid Other

## 2020-12-08 ENCOUNTER — Encounter: Payer: Self-pay | Admitting: Obstetrics and Gynecology

## 2020-12-08 ENCOUNTER — Other Ambulatory Visit: Payer: Self-pay

## 2020-12-08 ENCOUNTER — Observation Stay
Admission: EM | Admit: 2020-12-08 | Discharge: 2020-12-08 | Disposition: A | Discharge status: Home / Self Care | Payer: Medicaid Other

## 2020-12-08 DIAGNOSIS — O9A212 Injury, poisoning and certain other consequences of external causes complicating pregnancy, second trimester: Principal | ICD-10-CM | POA: Insufficient documentation

## 2020-12-08 DIAGNOSIS — Z7982 Long term (current) use of aspirin: Secondary | ICD-10-CM | POA: Insufficient documentation

## 2020-12-08 DIAGNOSIS — G40909 Epilepsy, unspecified, not intractable, without status epilepticus: Secondary | ICD-10-CM | POA: Insufficient documentation

## 2020-12-08 DIAGNOSIS — O10012 Pre-existing essential hypertension complicating pregnancy, second trimester: Secondary | ICD-10-CM | POA: Insufficient documentation

## 2020-12-08 DIAGNOSIS — Z3A23 23 weeks gestation of pregnancy: Secondary | ICD-10-CM | POA: Insufficient documentation

## 2020-12-08 DIAGNOSIS — R109 Unspecified abdominal pain: Secondary | ICD-10-CM

## 2020-12-08 MED ORDER — CALCIUM CARBONATE ANTACID 500 MG PO CHEW: 2.0000 | CHEWABLE_TABLET | ORAL | Status: DC | PRN

## 2020-12-08 MED ORDER — ACETAMINOPHEN 325 MG PO TABS: 650.0000 mg | ORAL_TABLET | ORAL | Status: DC | PRN

## 2020-12-08 NOTE — Progress Notes (Signed)
Pt discharged home per CNM order. Pt stable and ambulatory. Pt reports pain decreased to 2/10, describing general soreness. CNM made aware. An After Visit Summary was printed and given to the patient. Discharge education completed with patient including follow up instructions, medication list, with other d/c instructions . Pt received labor and bleeding precautions. Patient able to verbalize understanding, all questions fully answered. Patient instructed to return to ED, call 911, or call MD for any changes in condition. Pt discharged home via personal vehicle with support person.
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
## 2020-12-08 NOTE — Progress Notes (Signed)
Pt transported to Ultrasound.  

## 2021-03-09 ENCOUNTER — Encounter: Payer: Self-pay | Admitting: Obstetrics and Gynecology

## 2021-03-09 ENCOUNTER — Other Ambulatory Visit: Payer: Self-pay

## 2021-03-09 ENCOUNTER — Observation Stay
Admission: EM | Admit: 2021-03-09 | Discharge: 2021-03-10 | Disposition: A | Discharge status: Home / Self Care | Payer: Medicaid Other | Attending: Obstetrics and Gynecology | Admitting: Obstetrics and Gynecology

## 2021-03-09 DIAGNOSIS — O133 Gestational [pregnancy-induced] hypertension without significant proteinuria, third trimester: Secondary | ICD-10-CM | POA: Diagnosis present

## 2021-03-09 DIAGNOSIS — O99013 Anemia complicating pregnancy, third trimester: Secondary | ICD-10-CM | POA: Insufficient documentation

## 2021-03-09 DIAGNOSIS — D649 Anemia, unspecified: Secondary | ICD-10-CM | POA: Diagnosis not present

## 2021-03-09 DIAGNOSIS — O1493 Unspecified pre-eclampsia, third trimester: Secondary | ICD-10-CM | POA: Diagnosis present

## 2021-03-09 DIAGNOSIS — Z3A36 36 weeks gestation of pregnancy: Secondary | ICD-10-CM | POA: Diagnosis not present

## 2021-03-09 DIAGNOSIS — O163 Unspecified maternal hypertension, third trimester: Secondary | ICD-10-CM | POA: Diagnosis present
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
## 2021-03-09 NOTE — OB Triage Note (Signed)
Pt is a G2P0 and [redacted]w[redacted]d presenting to L&D with c/o "elevated BP at home." Pt denies HA, blurry vision, epigastric pain and had no clonus with +2 reflexes bilaterally. Pt confirms positive fetal movement and denies VB, LOF, ctx and pain. Monitors applied and assessing with blood pressure cycling.

## 2021-03-10 DIAGNOSIS — O133 Gestational [pregnancy-induced] hypertension without significant proteinuria, third trimester: Secondary | ICD-10-CM | POA: Diagnosis not present

## 2021-03-10 NOTE — OB Triage Note (Signed)
Discharge instructions reviewed and pts verbalized understanding. Pt has follow up visit on Monday. Red flag labor precautions reviewed. Pt stable at time of discharge and will ride home with mother.

## 2021-03-16 ENCOUNTER — Other Ambulatory Visit: Payer: Self-pay | Admitting: Obstetrics

## 2021-03-16 DIAGNOSIS — Z3490 Encounter for supervision of normal pregnancy, unspecified, unspecified trimester: Secondary | ICD-10-CM

## 2021-03-17 ENCOUNTER — Other Ambulatory Visit: Payer: Self-pay

## 2021-03-17 ENCOUNTER — Encounter
Admission: EM | Disposition: A | Discharge status: Home / Self Care | Payer: Self-pay | Source: Home / Self Care | Attending: Obstetrics

## 2021-03-17 ENCOUNTER — Encounter: Payer: Self-pay | Admitting: Obstetrics and Gynecology

## 2021-03-17 ENCOUNTER — Inpatient Hospital Stay: Payer: Medicaid Other | Admitting: Anesthesiology

## 2021-03-17 ENCOUNTER — Inpatient Hospital Stay
Admission: EM | Admit: 2021-03-17 | Discharge: 2021-03-19 | DRG: 788 | Disposition: A | Discharge status: Home / Self Care | Payer: Medicaid Other | Attending: Obstetrics | Admitting: Obstetrics

## 2021-03-17 DIAGNOSIS — Z20822 Contact with and (suspected) exposure to covid-19: Secondary | ICD-10-CM | POA: Diagnosis present

## 2021-03-17 DIAGNOSIS — Z3490 Encounter for supervision of normal pregnancy, unspecified, unspecified trimester: Secondary | ICD-10-CM

## 2021-03-17 DIAGNOSIS — D573 Sickle-cell trait: Secondary | ICD-10-CM | POA: Diagnosis present

## 2021-03-17 DIAGNOSIS — F419 Anxiety disorder, unspecified: Secondary | ICD-10-CM | POA: Diagnosis present

## 2021-03-17 DIAGNOSIS — O134 Gestational [pregnancy-induced] hypertension without significant proteinuria, complicating childbirth: Secondary | ICD-10-CM | POA: Diagnosis present

## 2021-03-17 DIAGNOSIS — O99344 Other mental disorders complicating childbirth: Secondary | ICD-10-CM | POA: Diagnosis present

## 2021-03-17 DIAGNOSIS — Z3A37 37 weeks gestation of pregnancy: Secondary | ICD-10-CM

## 2021-03-17 DIAGNOSIS — O9902 Anemia complicating childbirth: Secondary | ICD-10-CM | POA: Diagnosis present

## 2021-03-17 HISTORY — DX: Encounter for supervision of normal pregnancy, unspecified, unspecified trimester: Z34.90

## 2021-03-17 LAB — CBC
HCT: 32.7 % — ABNORMAL LOW (ref 36.0–46.0)
Hemoglobin: 11.7 g/dL — ABNORMAL LOW (ref 12.0–15.0)
MCH: 31.9 pg (ref 26.0–34.0)
MCHC: 35.8 g/dL (ref 30.0–36.0)
MCV: 89.1 fL (ref 80.0–100.0)
Platelets: 167 10*3/uL (ref 150–400)
RBC: 3.67 MIL/uL — ABNORMAL LOW (ref 3.87–5.11)
RDW: 13.6 % (ref 11.5–15.5)
WBC: 8 10*3/uL (ref 4.0–10.5)
nRBC: 0 % (ref 0.0–0.2)

## 2021-03-17 LAB — RESP PANEL BY RT-PCR (FLU A&B, COVID) ARPGX2
Influenza A by PCR: NEGATIVE
Influenza B by PCR: NEGATIVE
SARS Coronavirus 2 by RT PCR: NEGATIVE

## 2021-03-17 LAB — COMPREHENSIVE METABOLIC PANEL
ALT: 12 U/L (ref 0–44)
AST: 16 U/L (ref 15–41)
Albumin: 3.2 g/dL — ABNORMAL LOW (ref 3.5–5.0)
Alkaline Phosphatase: 76 U/L (ref 38–126)
Anion gap: 7 (ref 5–15)
BUN: 5 mg/dL — ABNORMAL LOW (ref 6–20)
CO2: 25 mmol/L (ref 22–32)
Calcium: 8.7 mg/dL — ABNORMAL LOW (ref 8.9–10.3)
Chloride: 103 mmol/L (ref 98–111)
Creatinine, Ser: 0.55 mg/dL (ref 0.44–1.00)
GFR, Estimated: >60 mL/min (ref >60)
Glucose, Bld: 82 mg/dL (ref 70–99)
Potassium: 3.4 mmol/L — ABNORMAL LOW (ref 3.5–5.1)
Sodium: 135 mmol/L (ref 135–145)
Total Bilirubin: 0.6 mg/dL (ref 0.3–1.2)
Total Protein: 6.9 g/dL (ref 6.5–8.1)

## 2021-03-17 LAB — TYPE AND SCREEN
ABO/RH(D): AB POS
Antibody Screen: NEGATIVE

## 2021-03-17 LAB — PROTEIN / CREATININE RATIO, URINE
Creatinine, Urine: 106 mg/dL
Protein Creatinine Ratio: 0.09 mg/mg{Cre} (ref 0.00–0.15)
Total Protein, Urine: 10 mg/dL

## 2021-03-17 SURGERY — Surgical Case
Anesthesia: Epidural

## 2021-03-17 MED ORDER — DEXAMETHASONE SODIUM PHOSPHATE 10 MG/ML IJ SOLN
INTRAMUSCULAR | Status: DC | PRN
  Administered 2021-03-17: 10 mg via INTRAVENOUS

## 2021-03-17 MED ORDER — FERROUS SULFATE 325 (65 FE) MG PO TABS
325.0000 mg | ORAL_TABLET | Freq: Two times a day (BID) | ORAL | Status: DC
  Administered 2021-03-18 – 2021-03-19 (×3): 325 mg via ORAL
  Filled 2021-03-17 (×3): qty 1

## 2021-03-17 MED ORDER — LIDOCAINE HCL (PF) 2 % IJ SOLN
INTRAMUSCULAR | Status: DC | PRN
  Administered 2021-03-17: 15 mg via INTRADERMAL

## 2021-03-17 MED ORDER — EPHEDRINE 5 MG/ML INJ: 10.0000 mg | INTRAVENOUS | Status: DC | PRN

## 2021-03-17 MED ORDER — FENTANYL-BUPIVACAINE-NACL 0.5-0.125-0.9 MG/250ML-% EP SOLN: 12.0000 mL/h | EPIDURAL | Status: DC | PRN

## 2021-03-17 MED ORDER — MISOPROSTOL 200 MCG PO TABS
ORAL_TABLET | ORAL | Status: AC
  Filled 2021-03-17: qty 4

## 2021-03-17 MED ORDER — DIPHENHYDRAMINE HCL 25 MG PO CAPS: 25.0000 mg | ORAL_CAPSULE | Freq: Four times a day (QID) | ORAL | Status: DC | PRN

## 2021-03-17 MED ORDER — MENTHOL 3 MG MT LOZG
1.0000 | LOZENGE | OROMUCOSAL | Status: DC | PRN
  Filled 2021-03-17: qty 9

## 2021-03-17 MED ORDER — KETOROLAC TROMETHAMINE 30 MG/ML IJ SOLN
INTRAMUSCULAR | Status: DC | PRN
  Administered 2021-03-17: 30 mg via INTRAVENOUS

## 2021-03-17 MED ORDER — OXYTOCIN-SODIUM CHLORIDE 30-0.9 UT/500ML-% IV SOLN
INTRAVENOUS | Status: AC
  Filled 2021-03-17: qty 500

## 2021-03-17 MED ORDER — OXYTOCIN-SODIUM CHLORIDE 30-0.9 UT/500ML-% IV SOLN
1.0000 m[IU]/min | INTRAVENOUS | Status: DC
Start: 2021-03-17 — End: 2021-03-17
  Administered 2021-03-17: 2 m[IU]/min via INTRAVENOUS
  Filled 2021-03-17: qty 500

## 2021-03-17 MED ORDER — OXYTOCIN-SODIUM CHLORIDE 30-0.9 UT/500ML-% IV SOLN
2.5000 [IU]/h | INTRAVENOUS | Status: DC
  Administered 2021-03-17: 50 [IU]/h via INTRAVENOUS

## 2021-03-17 MED ORDER — LIDOCAINE HCL (PF) 1 % IJ SOLN
INTRAMUSCULAR | Status: AC
  Filled 2021-03-17: qty 30

## 2021-03-17 MED ORDER — MORPHINE SULFATE (PF) 10 MG/ML IV SOLN
INTRAVENOUS | Status: DC | PRN
  Administered 2021-03-17 (×5): .5 mg via BUCCAL

## 2021-03-17 MED ORDER — CEFAZOLIN SODIUM-DEXTROSE 2-4 GM/100ML-% IV SOLN
2.0000 g | INTRAVENOUS | Status: AC
  Administered 2021-03-17: 2 g via INTRAVENOUS

## 2021-03-17 MED ORDER — MEASLES, MUMPS & RUBELLA VAC IJ SOLR
0.5000 mL | Freq: Once | INTRAMUSCULAR | Status: DC
  Filled 2021-03-17: qty 0.5

## 2021-03-17 MED ORDER — TETANUS-DIPHTH-ACELL PERTUSSIS 5-2.5-18.5 LF-MCG/0.5 IM SUSY
0.5000 mL | PREFILLED_SYRINGE | Freq: Once | INTRAMUSCULAR | Status: DC
  Filled 2021-03-17: qty 0.5

## 2021-03-17 MED ORDER — SOD CITRATE-CITRIC ACID 500-334 MG/5ML PO SOLN: 30.0000 mL | ORAL | Status: DC | PRN

## 2021-03-17 MED ORDER — GABAPENTIN 300 MG PO CAPS
300.0000 mg | ORAL_CAPSULE | Freq: Every day | ORAL | Status: DC
  Administered 2021-03-17 – 2021-03-18 (×2): 300 mg via ORAL
  Filled 2021-03-17 (×2): qty 1

## 2021-03-17 MED ORDER — OXYTOCIN-SODIUM CHLORIDE 30-0.9 UT/500ML-% IV SOLN
2.5000 [IU]/h | INTRAVENOUS | Status: AC
  Administered 2021-03-17 – 2021-03-18 (×2): 2.5 [IU]/h via INTRAVENOUS
  Filled 2021-03-17: qty 500

## 2021-03-17 MED ORDER — PHENYLEPHRINE HCL-NACL 20-0.9 MG/250ML-% IV SOLN
INTRAVENOUS | Status: DC | PRN
  Administered 2021-03-17: 10 ug/min via INTRAVENOUS

## 2021-03-17 MED ORDER — OXYTOCIN 10 UNIT/ML IJ SOLN
INTRAMUSCULAR | Status: AC
  Filled 2021-03-17: qty 2

## 2021-03-17 MED ORDER — OXYTOCIN BOLUS FROM INFUSION: 333.0000 mL | Freq: Once | INTRAVENOUS | Status: DC

## 2021-03-17 MED ORDER — SOD CITRATE-CITRIC ACID 500-334 MG/5ML PO SOLN: 30.0000 mL | ORAL | Status: DC

## 2021-03-17 MED ORDER — BUPIVACAINE HCL (PF) 0.5 % IJ SOLN
INTRAMUSCULAR | Status: AC
  Filled 2021-03-17: qty 30

## 2021-03-17 MED ORDER — LABETALOL HCL 5 MG/ML IV SOLN: 40.0000 mg | INTRAVENOUS | Status: DC | PRN

## 2021-03-17 MED ORDER — OXYCODONE HCL 5 MG PO TABS
5.0000 mg | ORAL_TABLET | ORAL | Status: DC | PRN
  Administered 2021-03-18: 5 mg via ORAL
  Administered 2021-03-19 (×2): 10 mg via ORAL
  Filled 2021-03-17: qty 2
  Filled 2021-03-17: qty 1
  Filled 2021-03-17: qty 2

## 2021-03-17 MED ORDER — BUPIVACAINE HCL (PF) 0.25 % IJ SOLN
INTRAMUSCULAR | Status: DC | PRN
  Administered 2021-03-17 (×2): 6 mL via EPIDURAL

## 2021-03-17 MED ORDER — WITCH HAZEL-GLYCERIN EX PADS: 1.0000 "application " | MEDICATED_PAD | CUTANEOUS | Status: DC | PRN

## 2021-03-17 MED ORDER — LACTATED RINGERS IV SOLN: INTRAVENOUS | Status: DC | PRN

## 2021-03-17 MED ORDER — ONDANSETRON HCL 4 MG/2ML IJ SOLN
INTRAMUSCULAR | Status: AC
  Filled 2021-03-17: qty 2

## 2021-03-17 MED ORDER — HYDRALAZINE HCL 20 MG/ML IJ SOLN: 10.0000 mg | INTRAMUSCULAR | Status: DC | PRN

## 2021-03-17 MED ORDER — ONDANSETRON HCL 4 MG/2ML IJ SOLN
4.0000 mg | Freq: Four times a day (QID) | INTRAMUSCULAR | Status: DC | PRN
  Administered 2021-03-17: 4 mg via INTRAVENOUS

## 2021-03-17 MED ORDER — AMMONIA AROMATIC IN INHA
RESPIRATORY_TRACT | Status: AC
  Filled 2021-03-17: qty 10

## 2021-03-17 MED ORDER — PHENYLEPHRINE 40 MCG/ML (10ML) SYRINGE FOR IV PUSH (FOR BLOOD PRESSURE SUPPORT)
80.0000 ug | PREFILLED_SYRINGE | INTRAVENOUS | Status: DC | PRN
Start: 2021-03-17 — End: 2021-03-17

## 2021-03-17 MED ORDER — PHENYLEPHRINE HCL (PRESSORS) 10 MG/ML IV SOLN
INTRAVENOUS | Status: DC | PRN
  Administered 2021-03-17: 160 ug via INTRAVENOUS

## 2021-03-17 MED ORDER — BUTORPHANOL TARTRATE 1 MG/ML IJ SOLN: 1.0000 mg | INTRAMUSCULAR | Status: DC | PRN

## 2021-03-17 MED ORDER — BUPIVACAINE LIPOSOME 1.3 % IJ SUSP
INTRAMUSCULAR | Status: AC
  Filled 2021-03-17: qty 20

## 2021-03-17 MED ORDER — LIDOCAINE HCL (PF) 1 % IJ SOLN: 30.0000 mL | INTRAMUSCULAR | Status: DC | PRN

## 2021-03-17 MED ORDER — FENTANYL-BUPIVACAINE-NACL 0.5-0.125-0.9 MG/250ML-% EP SOLN
EPIDURAL | Status: AC
  Filled 2021-03-17: qty 250

## 2021-03-17 MED ORDER — LIDOCAINE HCL (PF) 1 % IJ SOLN
INTRAMUSCULAR | Status: DC | PRN
  Administered 2021-03-17: 3 mL

## 2021-03-17 MED ORDER — SIMETHICONE 80 MG PO CHEW: 80.0000 mg | CHEWABLE_TABLET | ORAL | Status: DC | PRN

## 2021-03-17 MED ORDER — LACTATED RINGERS IV SOLN: INTRAVENOUS | Status: DC

## 2021-03-17 MED ORDER — ACETAMINOPHEN 500 MG PO TABS
1000.0000 mg | ORAL_TABLET | Freq: Four times a day (QID) | ORAL | Status: DC
  Administered 2021-03-17 – 2021-03-19 (×6): 1000 mg via ORAL
  Filled 2021-03-17 (×7): qty 2

## 2021-03-17 MED ORDER — KETOROLAC TROMETHAMINE 30 MG/ML IJ SOLN
30.0000 mg | Freq: Four times a day (QID) | INTRAMUSCULAR | Status: AC
  Administered 2021-03-17 – 2021-03-18 (×4): 30 mg via INTRAVENOUS
  Filled 2021-03-17 (×4): qty 1

## 2021-03-17 MED ORDER — SENNOSIDES-DOCUSATE SODIUM 8.6-50 MG PO TABS
2.0000 | ORAL_TABLET | ORAL | Status: DC
  Administered 2021-03-17 – 2021-03-18 (×2): 2 via ORAL
  Filled 2021-03-17 (×2): qty 2

## 2021-03-17 MED ORDER — SIMETHICONE 80 MG PO CHEW
80.0000 mg | CHEWABLE_TABLET | Freq: Three times a day (TID) | ORAL | Status: DC
  Administered 2021-03-18 – 2021-03-19 (×5): 80 mg via ORAL
  Filled 2021-03-17 (×5): qty 1

## 2021-03-17 MED ORDER — DEXAMETHASONE SODIUM PHOSPHATE 10 MG/ML IJ SOLN
INTRAMUSCULAR | Status: AC
  Filled 2021-03-17: qty 1

## 2021-03-17 MED ORDER — SOD CITRATE-CITRIC ACID 500-334 MG/5ML PO SOLN
ORAL | Status: AC
  Administered 2021-03-17: 30 mL via ORAL
  Filled 2021-03-17: qty 15

## 2021-03-17 MED ORDER — SODIUM CHLORIDE (PF) 0.9 % IJ SOLN
INTRAMUSCULAR | Status: AC
  Filled 2021-03-17: qty 50

## 2021-03-17 MED ORDER — LACTATED RINGERS IV SOLN: 500.0000 mL | Freq: Once | INTRAVENOUS | Status: DC

## 2021-03-17 MED ORDER — 0.9 % SODIUM CHLORIDE (POUR BTL) OPTIME
TOPICAL | Status: DC | PRN
  Administered 2021-03-17: 500 mL

## 2021-03-17 MED ORDER — BISACODYL 10 MG RE SUPP
10.0000 mg | Freq: Every day | RECTAL | Status: DC | PRN
  Filled 2021-03-17: qty 1

## 2021-03-17 MED ORDER — DIBUCAINE (PERIANAL) 1 % EX OINT: 1.0000 "application " | TOPICAL_OINTMENT | CUTANEOUS | Status: DC | PRN

## 2021-03-17 MED ORDER — LABETALOL HCL 5 MG/ML IV SOLN: 20.0000 mg | INTRAVENOUS | Status: DC | PRN

## 2021-03-17 MED ORDER — PANTOPRAZOLE SODIUM 40 MG PO TBEC
40.0000 mg | DELAYED_RELEASE_TABLET | Freq: Every day | ORAL | Status: DC
  Administered 2021-03-18 – 2021-03-19 (×2): 40 mg via ORAL
  Filled 2021-03-17 (×3): qty 1

## 2021-03-17 MED ORDER — SODIUM CHLORIDE 0.9 % IV SOLN
INTRAVENOUS | Status: DC | PRN
  Administered 2021-03-17: 100 mL

## 2021-03-17 MED ORDER — MISOPROSTOL 25 MCG QUARTER TABLET
25.0000 ug | ORAL_TABLET | ORAL | Status: DC | PRN
  Administered 2021-03-17: 25 ug via VAGINAL
  Filled 2021-03-17: qty 1

## 2021-03-17 MED ORDER — LACTATED RINGERS IV SOLN
500.0000 mL | INTRAVENOUS | Status: DC | PRN
  Administered 2021-03-17: 500 mL via INTRAVENOUS

## 2021-03-17 MED ORDER — HYDROCORTISONE (PERIANAL) 2.5 % EX CREA
1.0000 "application " | TOPICAL_CREAM | Freq: Three times a day (TID) | CUTANEOUS | Status: DC
  Administered 2021-03-18: 1 via RECTAL
  Filled 2021-03-17 (×2): qty 28.35

## 2021-03-17 MED ORDER — PHENYLEPHRINE 40 MCG/ML (10ML) SYRINGE FOR IV PUSH (FOR BLOOD PRESSURE SUPPORT): 80.0000 ug | PREFILLED_SYRINGE | INTRAVENOUS | Status: DC | PRN

## 2021-03-17 MED ORDER — MORPHINE SULFATE (PF) 0.5 MG/ML IJ SOLN
INTRAMUSCULAR | Status: AC
  Filled 2021-03-17: qty 10

## 2021-03-17 MED ORDER — ACETAMINOPHEN 325 MG PO TABS: 650.0000 mg | ORAL_TABLET | ORAL | Status: DC | PRN

## 2021-03-17 MED ORDER — IBUPROFEN 600 MG PO TABS
600.0000 mg | ORAL_TABLET | Freq: Four times a day (QID) | ORAL | Status: DC
  Administered 2021-03-19 (×3): 600 mg via ORAL
  Filled 2021-03-17 (×4): qty 1

## 2021-03-17 MED ORDER — FLEET ENEMA 7-19 GM/118ML RE ENEM: 1.0000 | ENEMA | Freq: Every day | RECTAL | Status: DC | PRN

## 2021-03-17 MED ORDER — LIDOCAINE-EPINEPHRINE (PF) 1.5 %-1:200000 IJ SOLN
INTRAMUSCULAR | Status: DC | PRN
  Administered 2021-03-17: 3 mL via PERINEURAL

## 2021-03-17 MED ORDER — SODIUM CHLORIDE 0.9 % IV SOLN
INTRAVENOUS | Status: AC
  Filled 2021-03-17: qty 500

## 2021-03-17 MED ORDER — PRENATAL MULTIVITAMIN CH
1.0000 | ORAL_TABLET | Freq: Every day | ORAL | Status: DC
  Administered 2021-03-18 – 2021-03-19 (×2): 1 via ORAL
  Filled 2021-03-17 (×2): qty 1

## 2021-03-17 MED ORDER — CEFAZOLIN SODIUM-DEXTROSE 2-4 GM/100ML-% IV SOLN
INTRAVENOUS | Status: AC
  Filled 2021-03-17: qty 100

## 2021-03-17 MED ORDER — COCONUT OIL OIL
1.0000 "application " | TOPICAL_OIL | Status: DC | PRN
  Filled 2021-03-17 (×2): qty 120

## 2021-03-17 MED ORDER — SODIUM CHLORIDE 0.9 % IV SOLN
500.0000 mg | INTRAVENOUS | Status: AC
  Administered 2021-03-17: 500 mg via INTRAVENOUS

## 2021-03-17 MED ORDER — TERBUTALINE SULFATE 1 MG/ML IJ SOLN
0.2500 mg | Freq: Once | INTRAMUSCULAR | Status: AC | PRN
Start: 2021-03-17 — End: 2021-03-17
  Administered 2021-03-17: 0.25 mg via SUBCUTANEOUS
  Filled 2021-03-17: qty 1

## 2021-03-17 MED ORDER — LABETALOL HCL 5 MG/ML IV SOLN: 80.0000 mg | INTRAVENOUS | Status: DC | PRN

## 2021-03-17 MED ORDER — DIPHENHYDRAMINE HCL 50 MG/ML IJ SOLN: 12.5000 mg | INTRAMUSCULAR | Status: DC | PRN

## 2021-03-17 SURGICAL SUPPLY — 30 items
CHLORAPREP W/TINT 26 (MISCELLANEOUS) ×2 IMPLANT
DRESSING TELFA 8X3 (GAUZE/BANDAGES/DRESSINGS) ×2 IMPLANT
DRSG TELFA 3X8 NADH (GAUZE/BANDAGES/DRESSINGS) ×2 IMPLANT
ELECT REM PT RETURN 9FT ADLT (ELECTROSURGICAL) ×2
ELECTRODE REM PT RTRN 9FT ADLT (ELECTROSURGICAL) ×1 IMPLANT
GAUZE SPONGE 4X4 12PLY STRL (GAUZE/BANDAGES/DRESSINGS) ×2 IMPLANT
GOWN STRL REUS W/ TWL LRG LVL3 (GOWN DISPOSABLE) ×3 IMPLANT
GOWN STRL REUS W/TWL LRG LVL3 (GOWN DISPOSABLE) ×3
MANIFOLD NEPTUNE II (INSTRUMENTS) ×2 IMPLANT
MAT PREVALON FULL STRYKER (MISCELLANEOUS) ×2 IMPLANT
NEEDLE HYPO 25GX1X1/2 BEV (NEEDLE) ×2 IMPLANT
NS IRRIG 1000ML POUR BTL (IV SOLUTION) ×2 IMPLANT
PACK C SECTION AR (MISCELLANEOUS) ×2 IMPLANT
PAD ABD 5X9 TENDERSORB (GAUZE/BANDAGES/DRESSINGS) ×2 IMPLANT
PAD OB MATERNITY 4.3X12.25 (PERSONAL CARE ITEMS) ×2 IMPLANT
PAD PREP 24X41 OB/GYN DISP (PERSONAL CARE ITEMS) ×2 IMPLANT
PENCIL SMOKE EVACUATOR (MISCELLANEOUS) ×2 IMPLANT
SCRUB EXIDINE 4% CHG 4OZ (MISCELLANEOUS) ×2 IMPLANT
STAPLER INSORB 30 2030 C-SECTI (MISCELLANEOUS) ×2 IMPLANT
SUT MNCRL 4-0 (SUTURE) ×1
SUT MNCRL 4-0 27XMFL (SUTURE) ×1
SUT VIC AB 0 CT1 36 (SUTURE) ×4 IMPLANT
SUT VIC AB 0 CTX 36 (SUTURE) ×2
SUT VIC AB 0 CTX36XBRD ANBCTRL (SUTURE) ×2 IMPLANT
SUT VIC AB 2-0 SH 27 (SUTURE) ×2
SUT VIC AB 2-0 SH 27XBRD (SUTURE) ×2 IMPLANT
SUTURE MNCRL 4-0 27XMF (SUTURE) ×1 IMPLANT
SYR 30ML LL (SYRINGE) ×4 IMPLANT
TAPE PAPER 2X10 WHT MICROPORE (GAUZE/BANDAGES/DRESSINGS) ×2 IMPLANT
WATER STERILE IRR 500ML POUR (IV SOLUTION) ×2 IMPLANT

## 2021-03-17 NOTE — Anesthesia Preprocedure Evaluation (Addendum)
Anesthesia Evaluation  Patient identified by MRN, date of birth, ID band Patient awake    Reviewed: Allergy & Precautions, NPO status , Patient's Chart, lab work & pertinent test results  Airway Mallampati: II  TM Distance: >3 FB Neck ROM: Full    Dental  (+) Teeth Intact   Pulmonary neg pulmonary ROS,           Cardiovascular hypertension,  Rhythm:Regular     Neuro/Psych Seizures -,     GI/Hepatic GERD  ,  Endo/Other  negative endocrine ROS  Renal/GU      Musculoskeletal   Abdominal   Peds  Hematology negative hematology ROS (+)   Anesthesia Other Findings   Reproductive/Obstetrics (+) Pregnancy                            Anesthesia Physical Anesthesia Plan  ASA: 3 and emergent  Anesthesia Plan: Epidural   Post-op Pain Management:    Induction:   PONV Risk Score and Plan:   Airway Management Planned:   Additional Equipment:   Intra-op Plan:   Post-operative Plan:   Informed Consent: I have reviewed the patients History and Physical, chart, labs and discussed the procedure including the risks, benefits and alternatives for the proposed anesthesia with the patient or authorized representative who has indicated his/her understanding and acceptance.       Plan Discussed with:   Anesthesia Plan Comments: (OSA score 1 Epidural working well. Will use for C/S. Discussed with patient. )      Anesthesia Quick Evaluation
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
## 2021-03-17 NOTE — Anesthesia Procedure Notes (Signed)
Epidural Patient location during procedure: OB Start time: 03/17/2021 4:22 PM  Staffing Anesthesiologist: Johny Drilling, MD Performed: anesthesiologist   Preanesthetic Checklist Completed: patient identified, IV checked, site marked, risks and benefits discussed, surgical consent, monitors and equipment checked, pre-op evaluation and timeout performed  Epidural Patient position: sitting Prep: ChloraPrep Patient monitoring: heart rate, continuous pulse ox and blood pressure Approach: midline Location: L3-L4 Injection technique: LOR saline  Needle:  Needle type: Tuohy  Needle gauge: 17 G Needle length: 9 cm Needle insertion depth: 7 cm Catheter type: closed end flexible Catheter size: 20 Guage Catheter at skin depth: 13 cm Test dose: negative and 1.5% lidocaine with Epi 1:200 K  Additional Notes Bupiv 6 ml via needle and 6 ml via catheter

## 2021-03-17 NOTE — Progress Notes (Signed)
G2P0010 at [redacted]w[redacted]d iol for gHTN, with cord presenting in the bulging bag of water. We have turned off pit and given terb. Pt feels overwhelmed but was consented for the c/s as below. Have called urgent section.   The risks of cesarean section discussed with the patient included but were not limited to: bleeding which may require transfusion or reoperation; infection which may require antibiotics; injury to bowel, bladder, ureters or other surrounding organs; injury to the fetus; need for additional procedures including hysterectomy in the event of a life-threatening hemorrhage; placental abnormalities wth subsequent pregnancies, incisional problems, thromboembolic phenomenon and other postoperative/anesthesia complications. The patient concurred with the proposed plan, giving informed written consent for the procedure.   Patient has been NPO and she will remain NPO for procedure. Anesthesia and OR aware. Preoperative prophylactic antibiotics and SCDs ordered on call to the OR.  To OR when ready.

## 2021-03-17 NOTE — Anesthesia Postprocedure Evaluation (Signed)
Anesthesia Post Note  Patient: [name]  Procedure(s) Performed: CESAREAN SECTION  Patient location during evaluation: PACU Anesthesia Type: Epidural Level of consciousness: awake, awake and alert, oriented and patient cooperative Pain management: pain level controlled Vital Signs Assessment: post-procedure vital signs reviewed and stable Respiratory status: spontaneous breathing Cardiovascular status: blood pressure returned to baseline and stable Anesthetic complications: no   No notable events documented.   Last Vitals:  Vitals:   03/17/21 1840 03/17/21 1845  BP:    Pulse:    Resp:    Temp:    SpO2: 99% 99%    Last Pain:  Vitals:   03/17/21 1700  TempSrc:   PainSc: 0-No pain                 Bethann Qualley

## 2021-03-17 NOTE — Progress Notes (Signed)
Labor Progress Note  [name] is a 25 y.o. G2P0010 at [redacted]w[redacted]d by ultrasound admitted for induction of labor due to [facility]  Subjective: resting in bed with grandmother and sister at bedside  Objective: BP (!) 141/88   Pulse 94   Temp 98.1 F (36.7 C) (Oral)   Resp 18   Ht 5\' 6"  (1.676 m)   Wt 83.5 kg   LMP 02/16/2020 (LMP Unknown)   BMI 29.70 kg/m  Notable VS details:   Fetal Assessment: FHT:  FHR: 130 bpm, variability: moderate,  accelerations:  Present,  decelerations:  Absent Category/reactivity:  Category I UC:   regular, every 1.5-2  minutes SVE:   2/60/-2 Membrane status: intact Amniotic color: N/A  Labs: Lab Results  Component Value Date   WBC 8.0 03/17/2021   HGB 11.7 (L) 03/17/2021   HCT 32.7 (L) 03/17/2021   MCV 89.1 03/17/2021   PLT 167 03/17/2021    Assessment / Plan: Induction of labor due to gestational hypertension,  progressing well on pitocin  Labor: Progressing normally, Cervical balloon placed Preeclampsia:  labs stable Fetal Wellbeing:  Category I Pain Control:  Epidural planned I/D:   GBS neg Anticipated MOD:  NSVD  [name] [name] [name], CNM 03/17/2021, 12:48 PM

## 2021-03-17 NOTE — Op Note (Addendum)
Cesarean Section Procedure Note  Date of procedure: 03/17/2021   Pre-operative Diagnosis: Intrauterine pregnancy at [redacted]w[redacted]d;  - gHTN, without severe features  - Funic presentation, pending cord prolapse felt clearly by baby's head with variables, still intact membranes  Post-operative Diagnosis: same, delivered.  Procedure: Urgent primary Low Transverse Cesarean Section through Pfannenstiel incision  Surgeon: Christeen Douglas, MD  Assistant(s):  Chari Manning, CNM  Anesthesia: Epidural anesthesia  Anesthesiologist: Johny Drilling, MD Anesthesiologist: Johny Drilling, MD CRNA: Lonia Mad, CRNA  Estimated Blood Loss:   610cc         Drains: foley         Total IV Fluids:  Urine Output:         Specimens: none         Complications:  None; patient tolerated the procedure well.         Disposition: PACU - hemodynamically stable.         Condition: stable  Findings:  A female infant "Ja'legend" in cephalic presentation with umbilical cord to the left of fetal head. Amniotic fluid - Clear  Birth weight 2530 g.  Apgars of 8 and 9 at one and five minutes respectively.  Intact placenta with a three-vessel cord.  Grossly normal uterus, tubes and ovaries bilaterally. No intraabdominal adhesions were noted.  Indications:  Pending cord prolapse presenting in bulging bag of water  G2P0010 at [redacted]w[redacted]d iol for gHTN, with cord presenting in the bulging bag of water. We have turned off pit and given terb. Urgent section. Presentation earlier in the day was hand, and after spinning babies movement the hand did reduce. However, funic presentation followed.  Procedure Details  The patient was taken to Operating Room, identified as the correct patient and the procedure verified as C-Section Delivery. A formal Time Out was held with all team members present and in agreement.  After induction of anesthesia, the patient was draped and prepped in the usual sterile  manner. A Pfannenstiel skin incision was made and carried down through the subcutaneous tissue to the fascia. Fascial incision was made and extended transversely with the Mayo scissors. The fascia was separated from the underlying rectus tissue superiorly and inferiorly. The peritoneum was identified and entered bluntly. Peritoneal incision was extended longitudinally. The utero-vesical peritoneal reflection was incised transversely and a bladder flap was created digitally.   A low transverse hysterotomy was made. The fetus was delivered atraumatically. The umbilical cord was clamped x2 and cut and the infant was handed to the awaiting pediatricians. The placenta was removed intact and appeared normal, intact, and with a 3-vessel cord.   The uterus was exteriorized and cleared of all clot and debris. The hysterotomy was closed with running sutures of 0-Vicryl . A second imbricating layer was placed with the same suture. Excellent hemostasis was observed. The peritoneal cavity was cleared of all clots and debris. The uterus was returned to the abdomen.   The pelvis was irrigated and again, excellent hemostasis was noted. The fascia was then reapproximated with running sutures of 0 Vicryl. The subcutaneous tissue was reapproximated with running sutures of 0 Vicry. The skin was reapproximated with Ensorb. 104ml (in 30 of 0.5% bupivicaine and 25ml of NSS) of liposomal bupivicaine placed in the fascial and skin lines.  Instrument, sponge, and needle counts were correct prior to the abdominal closure and at the conclusion of the case.   The patient tolerated the procedure well and was transferred to the recovery room in stable condition.  Christeen Douglas, MD 03/17/2021

## 2021-03-17 NOTE — Progress Notes (Signed)
Pt arrived to L&D for scheduled 0500 induction. RNs' attempted IV acces x3. IV consult order placed. Will wait to place induction medication until line is established.

## 2021-03-17 NOTE — Transfer of Care (Signed)
Immediate Anesthesia Transfer of Care Note  Patient: [name]  Procedure(s) Performed: CESAREAN SECTION  Patient Location: PACU  Anesthesia Type:Epidural  Level of Consciousness: awake, alert , oriented and patient cooperative  Airway & Oxygen Therapy: Patient Spontanous Breathing  Post-op Assessment: Report given to RN and Post -op Vital signs reviewed and stable  Post vital signs: Reviewed and stable  Last Vitals:  Vitals Value Taken Time  BP    Temp    Pulse    Resp    SpO2      Last Pain:  Vitals:   03/17/21 1700  TempSrc:   PainSc: 0-No pain      Patients Stated Pain Goal: 0 (03/17/21 0544)  Complications: No notable events documented.

## 2021-03-17 NOTE — Plan of Care (Signed)
Transferred to Room 339 PP. Alert and oriented with pleasant affect. Oriented to Room, Safety and Security and POC.

## 2021-03-17 NOTE — Progress Notes (Signed)
Called to bedside by RN with report of category 2 strip after cervical balloon came out. Recurrent variables down to the 90's, with good recovery and moderate variability.  I checked patent's cervix and fetal hand was presenting, verified by [place]. Spinning babies  forward facing inversion maneuver done, and presenting part is now cephalic.Patient got epidural placed, now resting in bed comfortably.Variables resolved. FHT's currently 130 bpm, moderate variability, contracting every 2-4 mins.  Chari Manning CNM

## 2021-03-18 ENCOUNTER — Encounter: Payer: Self-pay | Admitting: Obstetrics and Gynecology

## 2021-03-18 LAB — COMPREHENSIVE METABOLIC PANEL
ALT: 10 U/L (ref 0–44)
AST: 23 U/L (ref 15–41)
Albumin: 2.8 g/dL — ABNORMAL LOW (ref 3.5–5.0)
Alkaline Phosphatase: 71 U/L (ref 38–126)
Anion gap: 12 (ref 5–15)
BUN: <5 mg/dL — ABNORMAL LOW (ref 6–20)
CO2: 20 mmol/L — ABNORMAL LOW (ref 22–32)
Calcium: 9 mg/dL (ref 8.9–10.3)
Chloride: 105 mmol/L (ref 98–111)
Creatinine, Ser: 0.49 mg/dL (ref 0.44–1.00)
GFR, Estimated: >60 mL/min (ref >60)
Glucose, Bld: 128 mg/dL — ABNORMAL HIGH (ref 70–99)
Potassium: 3.9 mmol/L (ref 3.5–5.1)
Sodium: 137 mmol/L (ref 135–145)
Total Bilirubin: 0.7 mg/dL (ref 0.3–1.2)
Total Protein: 5.7 g/dL — ABNORMAL LOW (ref 6.5–8.1)

## 2021-03-18 LAB — CBC
HCT: 29.4 % — ABNORMAL LOW (ref 36.0–46.0)
Hemoglobin: 10.5 g/dL — ABNORMAL LOW (ref 12.0–15.0)
MCH: 31.7 pg (ref 26.0–34.0)
MCHC: 35.7 g/dL (ref 30.0–36.0)
MCV: 88.8 fL (ref 80.0–100.0)
Platelets: 186 10*3/uL (ref 150–400)
RBC: 3.31 MIL/uL — ABNORMAL LOW (ref 3.87–5.11)
RDW: 13.1 % (ref 11.5–15.5)
WBC: 18.2 10*3/uL — ABNORMAL HIGH (ref 4.0–10.5)
nRBC: 0 % (ref 0.0–0.2)

## 2021-03-18 LAB — PROTEIN / CREATININE RATIO, URINE
Creatinine, Urine: 35 mg/dL
Total Protein, Urine: <6 mg/dL

## 2021-03-18 MED ORDER — NIFEDIPINE ER OSMOTIC RELEASE 30 MG PO TB24
30.0000 mg | ORAL_TABLET | Freq: Every day | ORAL | Status: DC
  Administered 2021-03-18 – 2021-03-19 (×2): 30 mg via ORAL
  Filled 2021-03-18 (×2): qty 1

## 2021-03-18 MED ORDER — LABETALOL HCL 5 MG/ML IV SOLN
INTRAVENOUS | Status: AC
  Filled 2021-03-18: qty 4

## 2021-03-18 MED ORDER — LABETALOL HCL 5 MG/ML IV SOLN
INTRAVENOUS | Status: AC
  Administered 2021-03-18: 20 mg via INTRAVENOUS
  Filled 2021-03-18: qty 4

## 2021-03-18 MED ORDER — MAGNESIUM SULFATE BOLUS VIA INFUSION
4.0000 g | Freq: Once | INTRAVENOUS | Status: AC
  Administered 2021-03-18: 4 g via INTRAVENOUS
  Filled 2021-03-18: qty 1000

## 2021-03-18 MED ORDER — MAGNESIUM SULFATE 40 GM/1000ML IV SOLN
2.0000 g/h | INTRAVENOUS | Status: DC
  Administered 2021-03-18: 2 g/h via INTRAVENOUS
  Filled 2021-03-18 (×2): qty 1000

## 2021-03-18 MED ORDER — LORAZEPAM 2 MG/ML IJ SOLN: 0.5000 mg | Freq: Once | INTRAMUSCULAR | Status: DC

## 2021-03-18 MED ORDER — LABETALOL HCL 5 MG/ML IV SOLN: 20.0000 mg | Freq: Once | INTRAVENOUS | Status: AC

## 2021-03-18 MED ORDER — LABETALOL HCL 5 MG/ML IV SOLN: 20.0000 mg | INTRAVENOUS | Status: DC | PRN

## 2021-03-18 MED ORDER — LABETALOL HCL 5 MG/ML IV SOLN
40.0000 mg | INTRAVENOUS | Status: DC | PRN
  Administered 2021-03-18: 40 mg via INTRAVENOUS
  Filled 2021-03-18: qty 8

## 2021-03-18 MED ORDER — CALCIUM GLUCONATE 10 % IV SOLN
INTRAVENOUS | Status: AC
  Filled 2021-03-18: qty 10

## 2021-03-18 MED ORDER — LABETALOL HCL 5 MG/ML IV SOLN: 80.0000 mg | INTRAVENOUS | Status: DC | PRN

## 2021-03-18 MED ORDER — HYDRALAZINE HCL 20 MG/ML IJ SOLN: 10.0000 mg | INTRAMUSCULAR | Status: DC | PRN
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
## 2021-03-18 NOTE — Progress Notes (Signed)
Labetalol 20mg  given as per order.
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
## 2021-03-18 NOTE — Progress Notes (Signed)
Transferred to Room 339 PP Mag. Alert and oriented with pleasant affect. Color good, skin w&d. Oriented to Room,Instructed in Fall Prevention. Safety and Security and POC. Pt. V/o.

## 2021-03-18 NOTE — Progress Notes (Signed)
Dr. Dalbert Garnet notified of elevated B/P's. Order for Labetalol 20mg  received.

## 2021-03-18 NOTE — Progress Notes (Signed)
Patient has stated since 0700 that she was having a visitor coming "shortly", and has not had any visitors all day. Patient was expecting a visitor to bring her breakfast, she states that it was her grandma. Patient said that her "godmother" is bringing her "tacos" for lunch. Nurse has offered patient a menu to order food, and patient continues to state that someone is "on the way". No visitors for patient all day and the patient has not had anything to eat. Patient will ask for liquids, juice, to drink.

## 2021-03-18 NOTE — Progress Notes (Signed)
Patient's grandmother and a friend came by to visit. The friend brought the  patient something to eat. The patient ate about 75% of her meal. Patient was on phone with boyfriend, Vincenza Hews, when RN in room. Patient appeared to be frustrated and the patient's grandmother was telling her to get off the phone, "so she did not raise her blood pressure". Patient's visitors were holding the baby.

## 2021-03-18 NOTE — Progress Notes (Signed)
Pt. Transferred to L&D.

## 2021-03-18 NOTE — Progress Notes (Signed)
No notable bleeding on the dressing at the incisional site. Dressing is still clean, dry and intact.

## 2021-03-18 NOTE — Progress Notes (Signed)
CNM requested that patient have a honeycomb applied to incision. Nurse removed pressure dressing and there was bright red blood still oozing from the incisional site on the left side of the incision. Nurse had midwife called to assess the incision. Midwife advised to apply steristrips and a pressure dressing (4x4, abd and tape) after speaking with the provider. New dressing applied.

## 2021-03-18 NOTE — Progress Notes (Signed)
Severe range BP recorded. 20mg  iv lab ordered stat and PreE labs with meds titration ordered. Mag bolus and 24hr of infusion ordered.

## 2021-03-19 ENCOUNTER — Other Ambulatory Visit: Payer: Self-pay

## 2021-03-19 ENCOUNTER — Encounter: Payer: Self-pay | Admitting: Obstetrics and Gynecology

## 2021-03-19 LAB — COMPREHENSIVE METABOLIC PANEL
ALT: 11 U/L (ref 0–44)
AST: 20 U/L (ref 15–41)
Albumin: 2.7 g/dL — ABNORMAL LOW (ref 3.5–5.0)
Alkaline Phosphatase: 64 U/L (ref 38–126)
Anion gap: 9 (ref 5–15)
BUN: 7 mg/dL (ref 6–20)
CO2: 22 mmol/L (ref 22–32)
Calcium: 8.5 mg/dL — ABNORMAL LOW (ref 8.9–10.3)
Chloride: 104 mmol/L (ref 98–111)
Creatinine, Ser: 0.45 mg/dL (ref 0.44–1.00)
GFR, Estimated: >60 mL/min (ref >60)
Glucose, Bld: 75 mg/dL (ref 70–99)
Potassium: 4.1 mmol/L (ref 3.5–5.1)
Sodium: 135 mmol/L (ref 135–145)
Total Bilirubin: 0.5 mg/dL (ref 0.3–1.2)
Total Protein: 5.6 g/dL — ABNORMAL LOW (ref 6.5–8.1)

## 2021-03-19 LAB — CBC
HCT: 29.1 % — ABNORMAL LOW (ref 36.0–46.0)
Hemoglobin: 10.4 g/dL — ABNORMAL LOW (ref 12.0–15.0)
MCH: 31.7 pg (ref 26.0–34.0)
MCHC: 35.7 g/dL (ref 30.0–36.0)
MCV: 88.7 fL (ref 80.0–100.0)
Platelets: 173 10*3/uL (ref 150–400)
RBC: 3.28 MIL/uL — ABNORMAL LOW (ref 3.87–5.11)
RDW: 13.4 % (ref 11.5–15.5)
WBC: 11.4 10*3/uL — ABNORMAL HIGH (ref 4.0–10.5)
nRBC: 0 % (ref 0.0–0.2)

## 2021-03-19 MED ORDER — NIFEDIPINE ER 30 MG PO TB24
30.0000 mg | ORAL_TABLET | Freq: Every day | ORAL | 0 refills | Status: DC
  Filled 2021-03-19: qty 30, 30d supply, fill #0

## 2021-03-19 MED ORDER — OXYCODONE HCL 5 MG PO TABS
5.0000 mg | ORAL_TABLET | Freq: Four times a day (QID) | ORAL | 0 refills | Status: AC | PRN
  Filled 2021-03-19: qty 28, 7d supply, fill #0

## 2021-03-19 MED ORDER — SENNOSIDES-DOCUSATE SODIUM 8.6-50 MG PO TABS
2.0000 | ORAL_TABLET | ORAL | 0 refills | Status: DC
  Filled 2021-03-19: qty 30, 15d supply, fill #0

## 2021-03-19 MED ORDER — IBUPROFEN 600 MG PO TABS
600.0000 mg | ORAL_TABLET | Freq: Four times a day (QID) | ORAL | 0 refills | Status: DC
  Filled 2021-03-19: qty 30, 8d supply, fill #0

## 2021-03-19 MED ORDER — MEDROXYPROGESTERONE ACETATE 150 MG/ML IM SUSP: 150.0000 mg | Freq: Once | INTRAMUSCULAR | Status: DC

## 2021-03-19 MED ORDER — COCONUT OIL OIL: 1.0000 "application " | TOPICAL_OIL | 0 refills | Status: DC | PRN

## 2021-03-19 MED ORDER — MEDROXYPROGESTERONE ACETATE 150 MG/ML IM SUSP
150.0000 mg | Freq: Once | INTRAMUSCULAR | Status: AC
  Administered 2021-03-19: 150 mg via INTRAMUSCULAR
  Filled 2021-03-19: qty 1

## 2021-03-19 MED ORDER — MEDROXYPROGESTERONE ACETATE 150 MG/ML IM SUSY
150.0000 mg | PREFILLED_SYRINGE | Freq: Once | INTRAMUSCULAR | 3 refills | Status: DC
  Filled 2021-03-19: qty 1, 1d supply, fill #0

## 2021-03-19 MED ORDER — ACETAMINOPHEN 500 MG PO TABS
1000.0000 mg | ORAL_TABLET | Freq: Four times a day (QID) | ORAL | 0 refills | Status: DC
  Filled 2021-03-19: qty 30, 4d supply, fill #0

## 2021-03-19 MED ORDER — SIMETHICONE 80 MG PO CHEW
80.0000 mg | CHEWABLE_TABLET | Freq: Three times a day (TID) | ORAL | 0 refills | Status: DC
  Filled 2021-03-19: qty 30, 10d supply, fill #0

## 2021-03-19 NOTE — Progress Notes (Signed)
   03/19/21 0910  Clinical Encounter Type  Visited With Patient and family together  Visit Type Initial  Referral From Nurse  Consult/Referral To Chaplain  Spiritual Encounters  Spiritual Needs Emotional  Chaplain provided emotional and spiritual support for Pt. Pt was tired but in good spirits, and proud to be holding her newborn baby boy.

## 2021-03-19 NOTE — Consult Note (Signed)
Brief Pharmacy Note  Pharmacy has been consulted for our anti-hypertensive meds to beds program.   Patient has agreed to the program, and is aware she will be responsible for her regular outpatient prescription copay.   The patient's preferred pharmacy in Epic has been changed to [facility] employee pharmacy (so the electronic prescriptions can be sent there instead of their prior designated pharmacy.)    The electronic rx must be sent to employee pharmacy by 4:30pm the day of discharge for the meds to beds delivery to be possible.  1405: Medications delivered to patient and counseling completed   Thank you for including pharmacy in this patient's care.  Sharen Hones, PharmD, BCPS Clinical Pharmacist   03/19/2021 3:47 PM

## 2021-03-19 NOTE — Plan of Care (Signed)
Patient appropriate for discharge.

## 2021-03-19 NOTE — Lactation Note (Signed)
This note was copied from a baby's chart. Lactation Consultation Note  Patient Name: [name] [id number] Date: 03/19/2021 Reason for consult: Initial assessment;Primapara;Early term 37-38.6wks;Other (Comment);Infant < 6lbs (c-section) Age:25 years  Initial lactation visit. Mom is P1, c-section delivery 37 hours ago. Baby born at [redacted]w[redacted]d and less than 6lbs. Baby passed 24hr screens, and has a 4% weight loss. Mom's feeding choice is breast and bottle, and unsure about the continuation of breastfeeding.  Mom has been given a nipple shield for assistance with latch due to flat nipples bilaterally. LC concurs with need for nipple shield and observes a feeding at bedside this morning. LC assisted with pillow support, position of baby, and latching. Beginning of feeding went well, mom started to complain about nipple pain half way through. LC repositioned baby, encouraging mom to keep baby deep on the breast tissue with cheeks and nose touching to ensure he remains in a deep latch. Baby fed well with the nipple shield for 17 minutes, colostrum visible in shield when he pushed away.   Due to infants size, and mom unsure re: future feeding plans, mom was set-up with DEBP at bedside. Mom educated on parts/pieces, use of pump, and cleaning of parts/pieces each use. Discussed pumping post feedings/ every 3 hours to stimulate and encourage the onset of an adequate milk production.   LC provided education on importance of frequent feedings, every 2-3 hours, breast stimulation, milk supply and demand, and normal course of lactation.  Whiteboard updated with LC name/number; encouraged to call with any questions and for ongoing BF assistance as needed.  Maternal Data Has patient been taught Hand Expression?: Yes Does the patient have breastfeeding experience prior to this delivery?: No  Feeding Mother's Current Feeding Choice: Breast Milk and Formula  LATCH Score Latch: Grasps breast easily, tongue  down, lips flanged, rhythmical sucking.  Audible Swallowing: A few with stimulation  Type of Nipple: Flat  Comfort (Breast/Nipple): Filling, red/small blisters or bruises, mild/mod discomfort  Hold (Positioning): Assistance needed to correctly position infant at breast and maintain latch.  LATCH Score: 6   Lactation Tools Discussed/Used Tools: Nipple Dorris Carnes;Pump Nipple shield size: 20 Breast pump type: Double-Electric Breast Pump Pump Education: Setup, frequency, and cleaning;Milk Storage Reason for Pumping: mom's choice Pumping frequency: q 3 hours  Interventions Interventions: Assisted with latch;Breast feeding basics reviewed;Hand express;Support pillows;Position options;Education  Discharge Pump: Personal  Consult Status Consult Status: Follow-up Date: 03/19/21 Follow-up type: In-patient    Danford Bad 03/19/2021, 10:30 AM

## 2021-03-19 NOTE — Progress Notes (Deleted)
Post Partum Day 2 Subjective: Doing well, no complaints.  Tolerating regular diet, pain with PO meds, voiding and ambulating without difficulty.  No CP SOB Fever,Chills, N/V or leg pain; denies nipple or breast pain, no HA change of vision, RUQ/epigastric pain  Objective: BP 121/70 (BP Location: Right Arm)   Pulse 93   Temp 98.3 F (36.8 C) (Oral)   Resp 18   Ht _0  (1.676 m)   Wt 80.1 kg   LMP 02/16/2020 (LMP Unknown)   SpO2 98%   Breastfeeding Unknown   BMI 28.50 kg/m    Physical Exam:  General: NAD Breasts: soft/nontender nipple no engorgement,  CV: RRR Pulm: nl effort, CTABL Abdomen: soft, NT, BS x 4 Incision: Dsg CDI/Steristrips intact/no erythema or drainage Lochia: moderate Uterine Fundus: fundus firm and 2 fb below umbilicus DVT Evaluation: no cords, ttp LEs   Recent Labs    03/17/21 0611 03/18/21 0530  HGB 11.7* 10.5*  HCT 32.7* 29.4*  WBC 8.0 18.2*  PLT 167 186    Assessment/Plan: 25 y.o. G2P1011 postpartum day # 2  - Continue routine PP care - Lactation consult.  - Discussed contraceptive options including implant, IUDs hormonal and non-hormonal, injection, pills/ring/patch, condoms, and NFP. She would like Depo shot. - Acute blood loss anemia - hemodynamically stable and asymptomatic; start po ferrous sulfate BID with stool softeners  - Immunization status: Needs MMR prior to DC/ all other Imms up to date - Continue Procardia 81m XL, BP from last 24hrs reviewed. CBC and CMP ordered for this morning.    Disposition: Does not desire Dc home today.     DGertie Fey CNM _1 @ 8:36 AM

## 2021-03-26 ENCOUNTER — Other Ambulatory Visit: Payer: Self-pay

## 2021-05-12 IMAGING — US US OB < 14 WEEKS - US OB TV
1 series · 14 of 28 positions shown · non-contrast
Comparison: None.

CLINICAL DATA: Vaginal bleeding

EXAM:
OBSTETRIC <14 WK US AND TRANSVAGINAL OB US
TECHNIQUE: Both transabdominal and transvaginal ultrasound examinations were
performed for complete evaluation of the gestation as well as the
maternal uterus, adnexal regions, and pelvic cul-de-sac.
Transvaginal technique was performed to assess early pregnancy.

[Series 1: us ob less than 14 weeks with ob transvaginal · 14 of 130 slices shown]
[im 5/130]
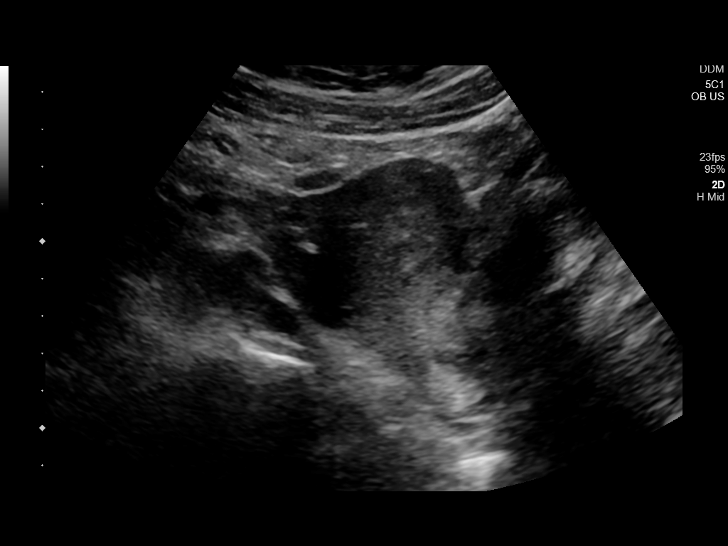
[im 15/130]
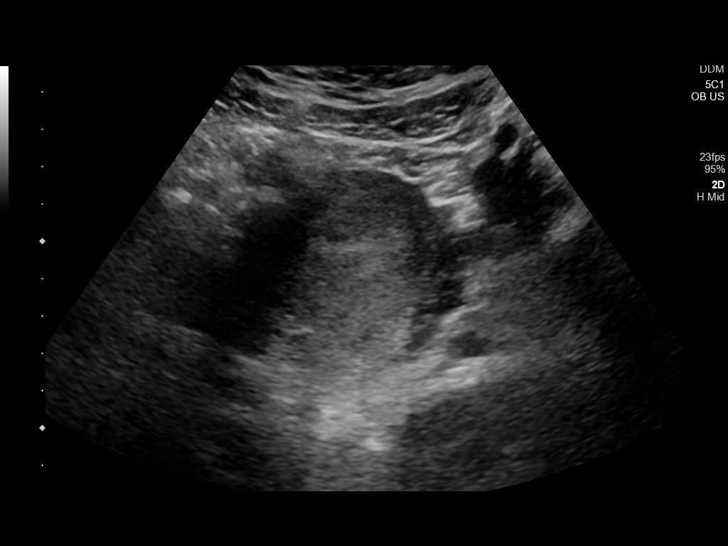
[im 24/130]
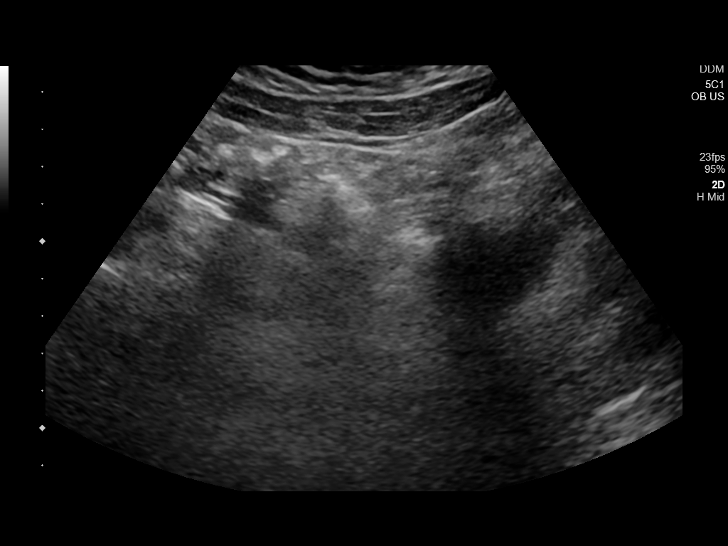
[im 34/130]
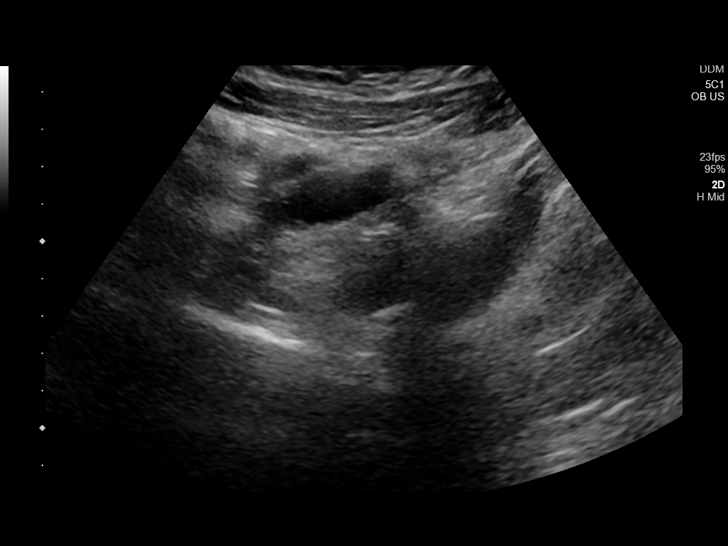
[im 44/130]
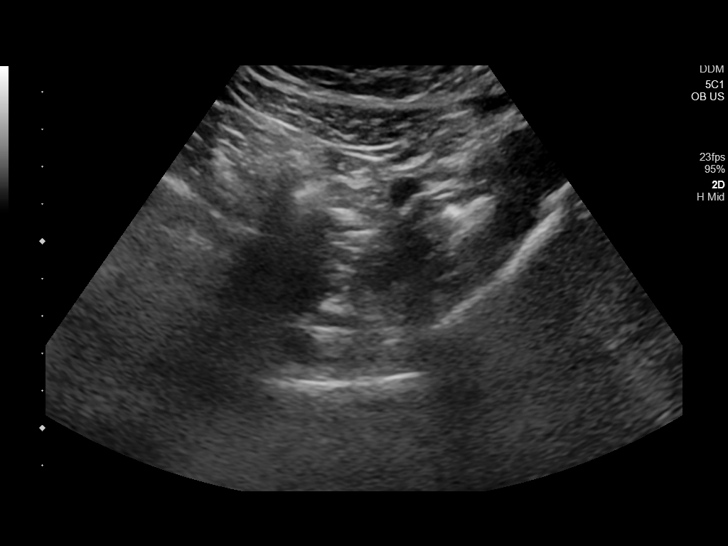
[im 53/130]
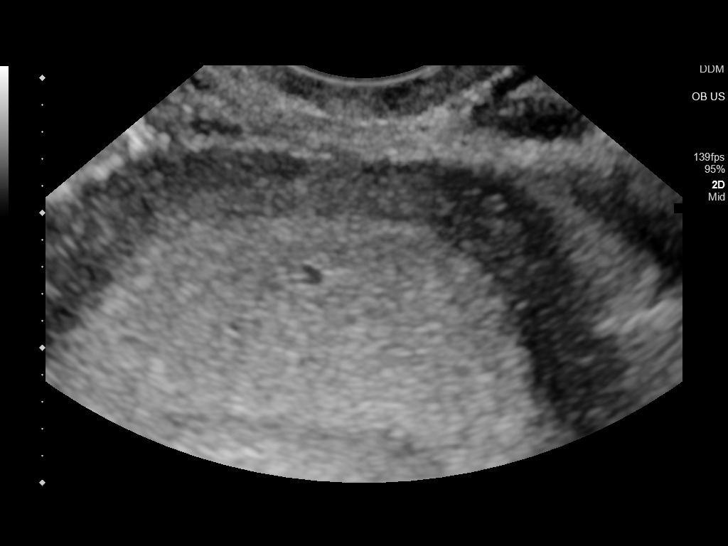
[im 63/130]
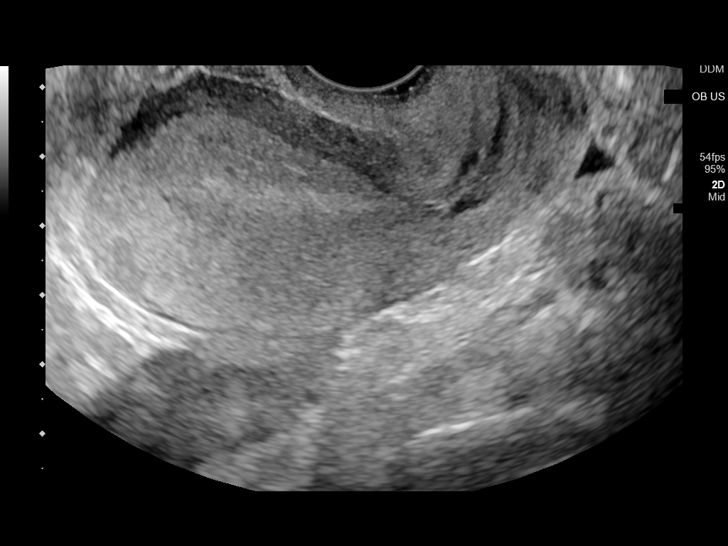
[im 72/130]
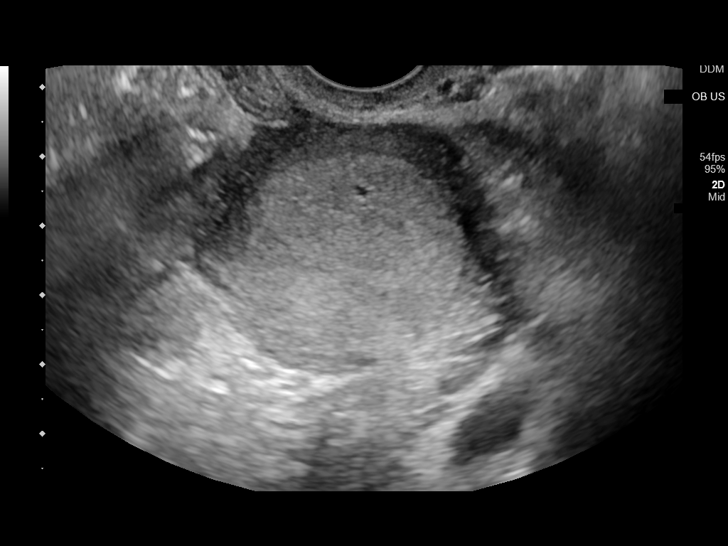
[im 82/130]
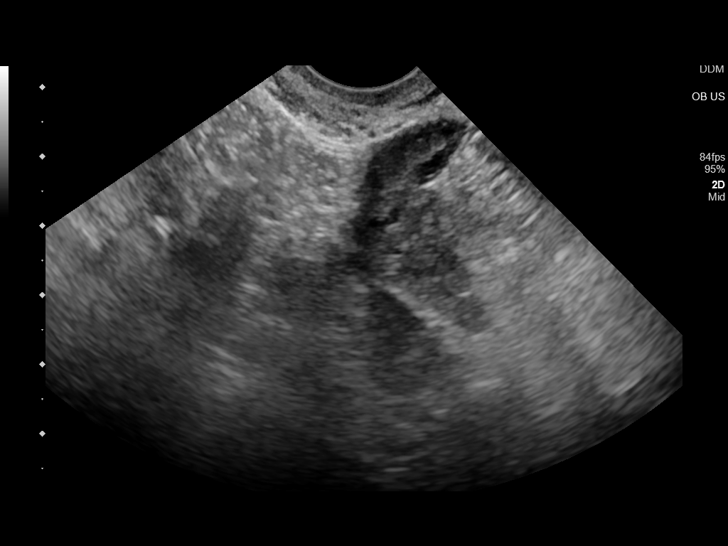
[im 91/130]
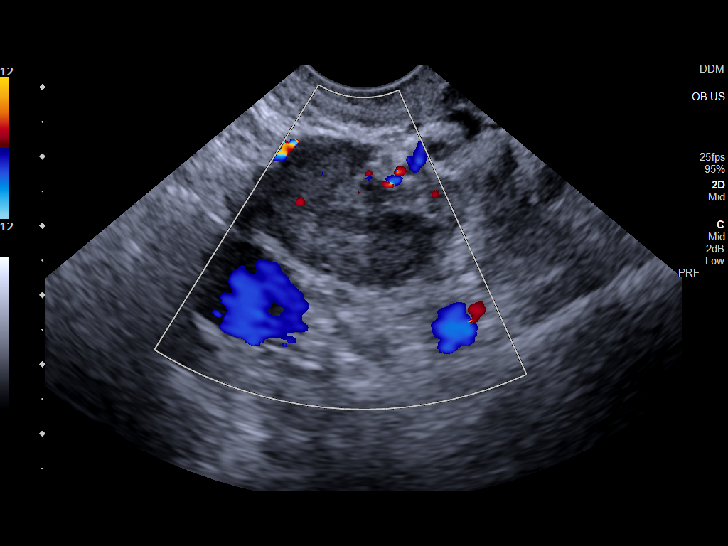
[im 101/130]
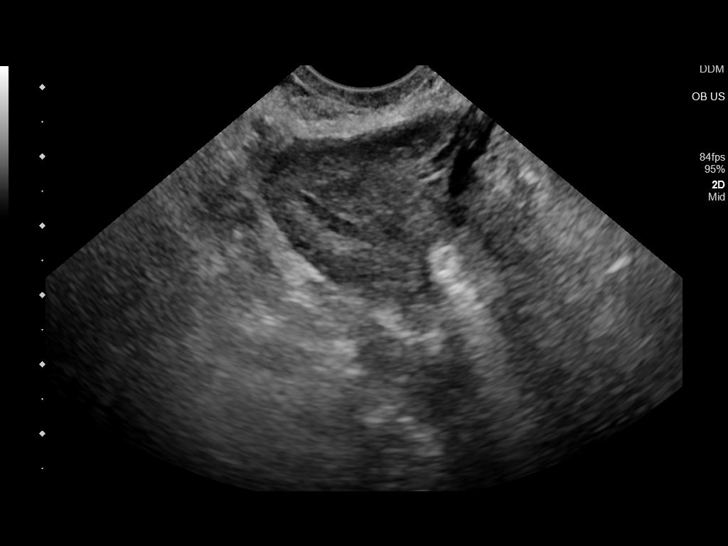
[im 110/130]
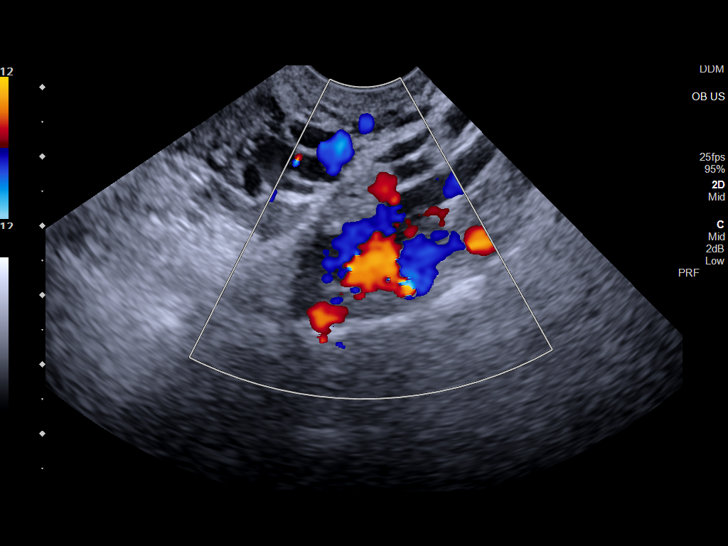
[im 120/130]
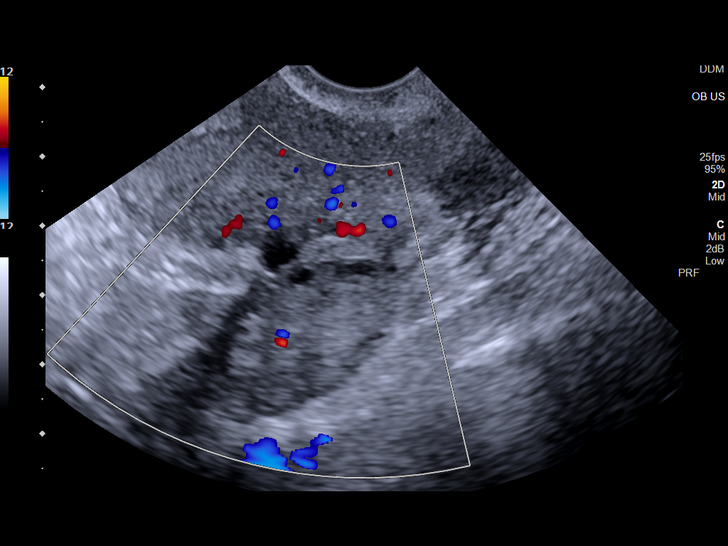
[im 130/130]
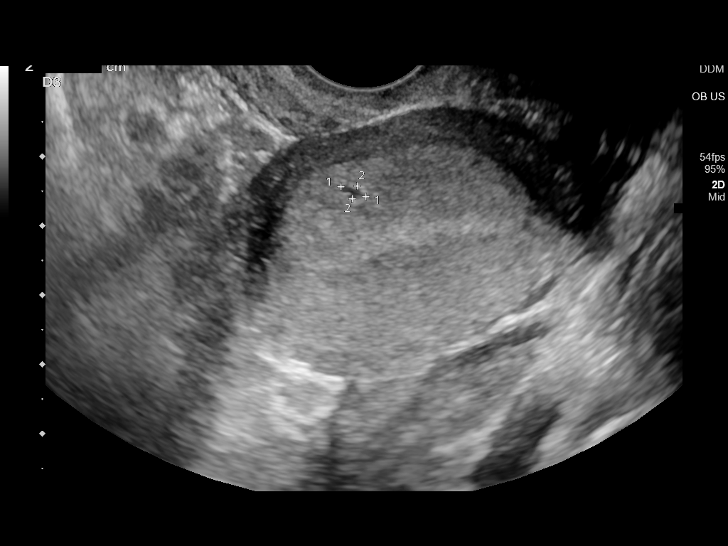

[14 of 28 positions shown; findings below may reference images not displayed]

FINDINGS: Intrauterine gestational sac: None

Yolk sac:  Not Visualized.

Embryo:  Not Visualized.

Maternal uterus/adnexae: Nonspecific 4 mm hypoechoic area within the
fundal endometrium. Adnexae are unremarkable. No free fluid.
IMPRESSION: No definite intrauterine pregnancy. Nonspecific 4 mm hypoechoic area
within the fundal endometrium could reflect an early or abnormal
gestational sac. Follow-up ultrasound and quantitative B-HCG levels
suggested.

## 2021-08-14 IMAGING — CR DG CHEST 2V
2 series · 2 of 2 positions shown · non-contrast
Comparison: 02/23/2020

CLINICAL DATA: Chest pain

EXAM:
CHEST - 2 VIEW

[w chest pa]
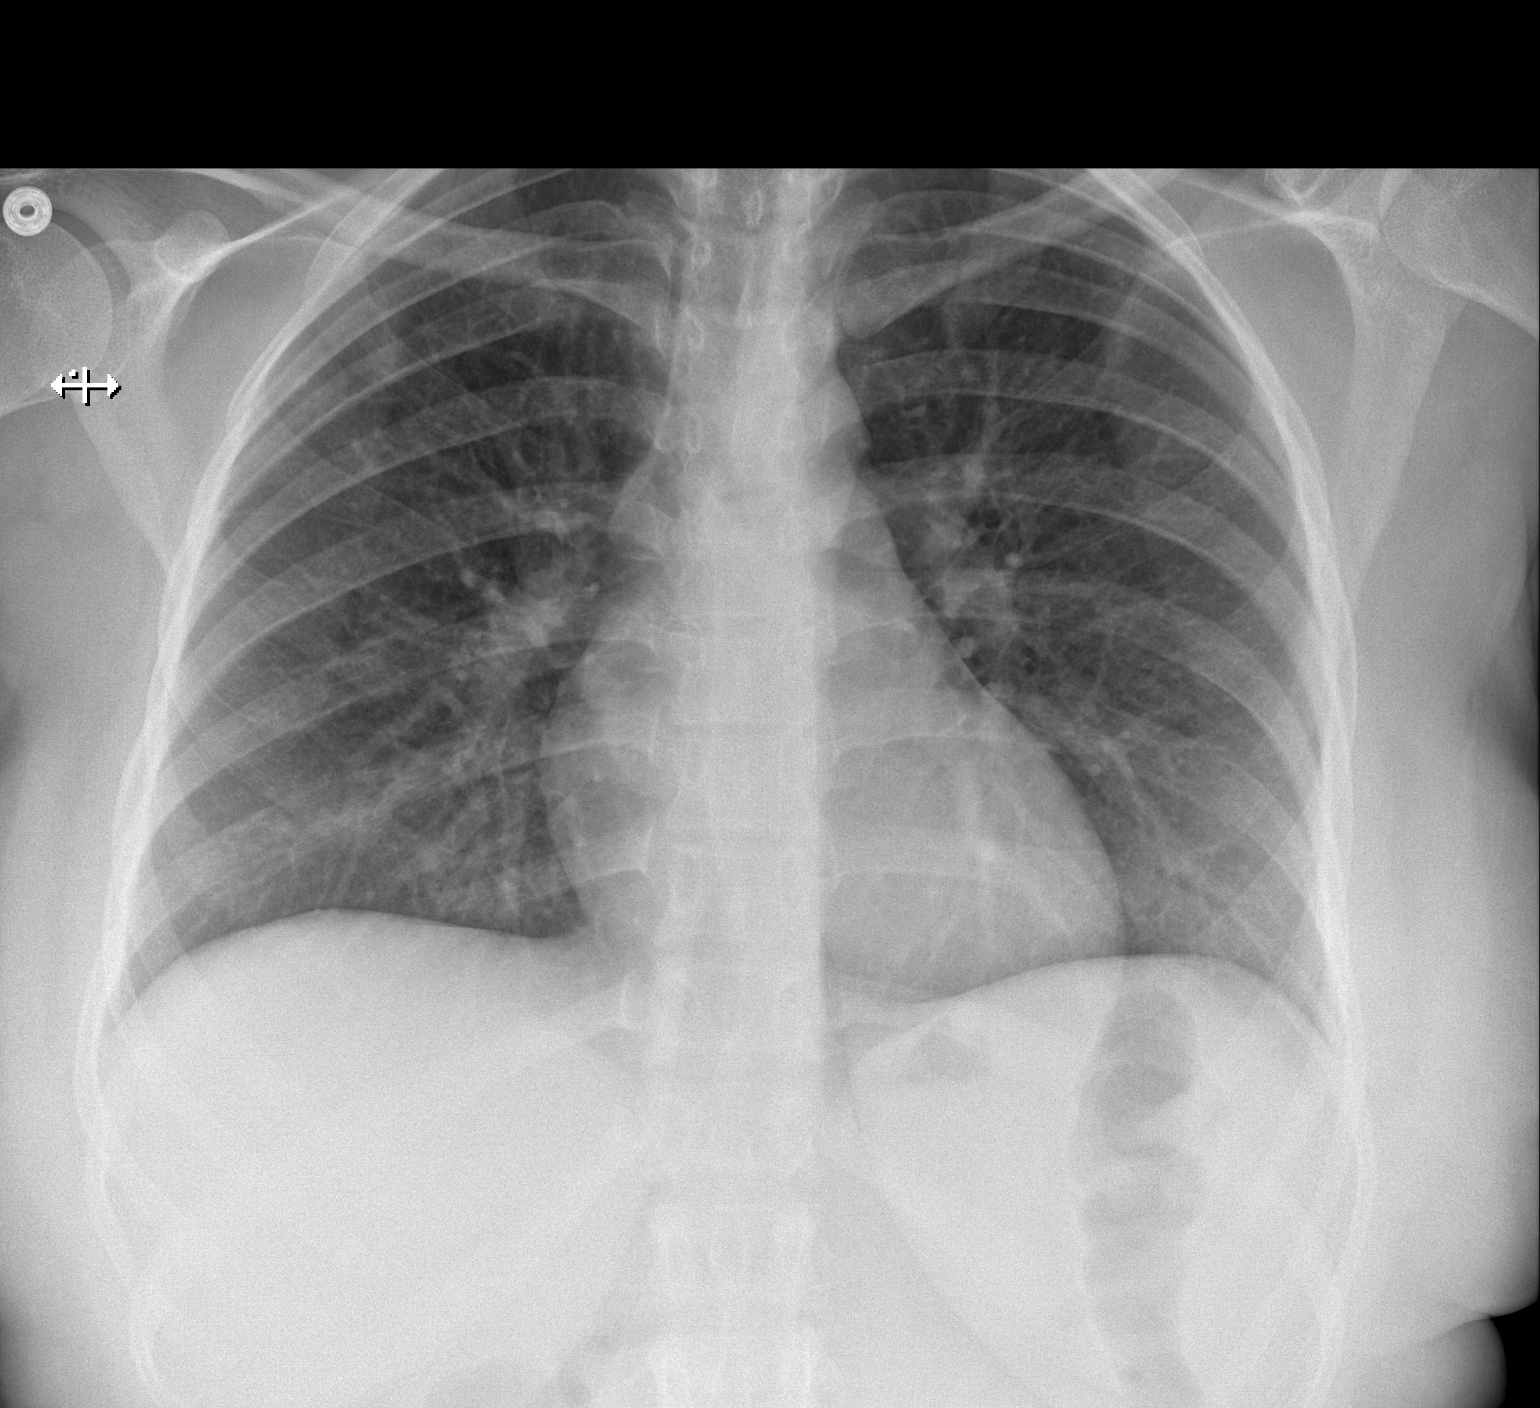

[w chest lat]
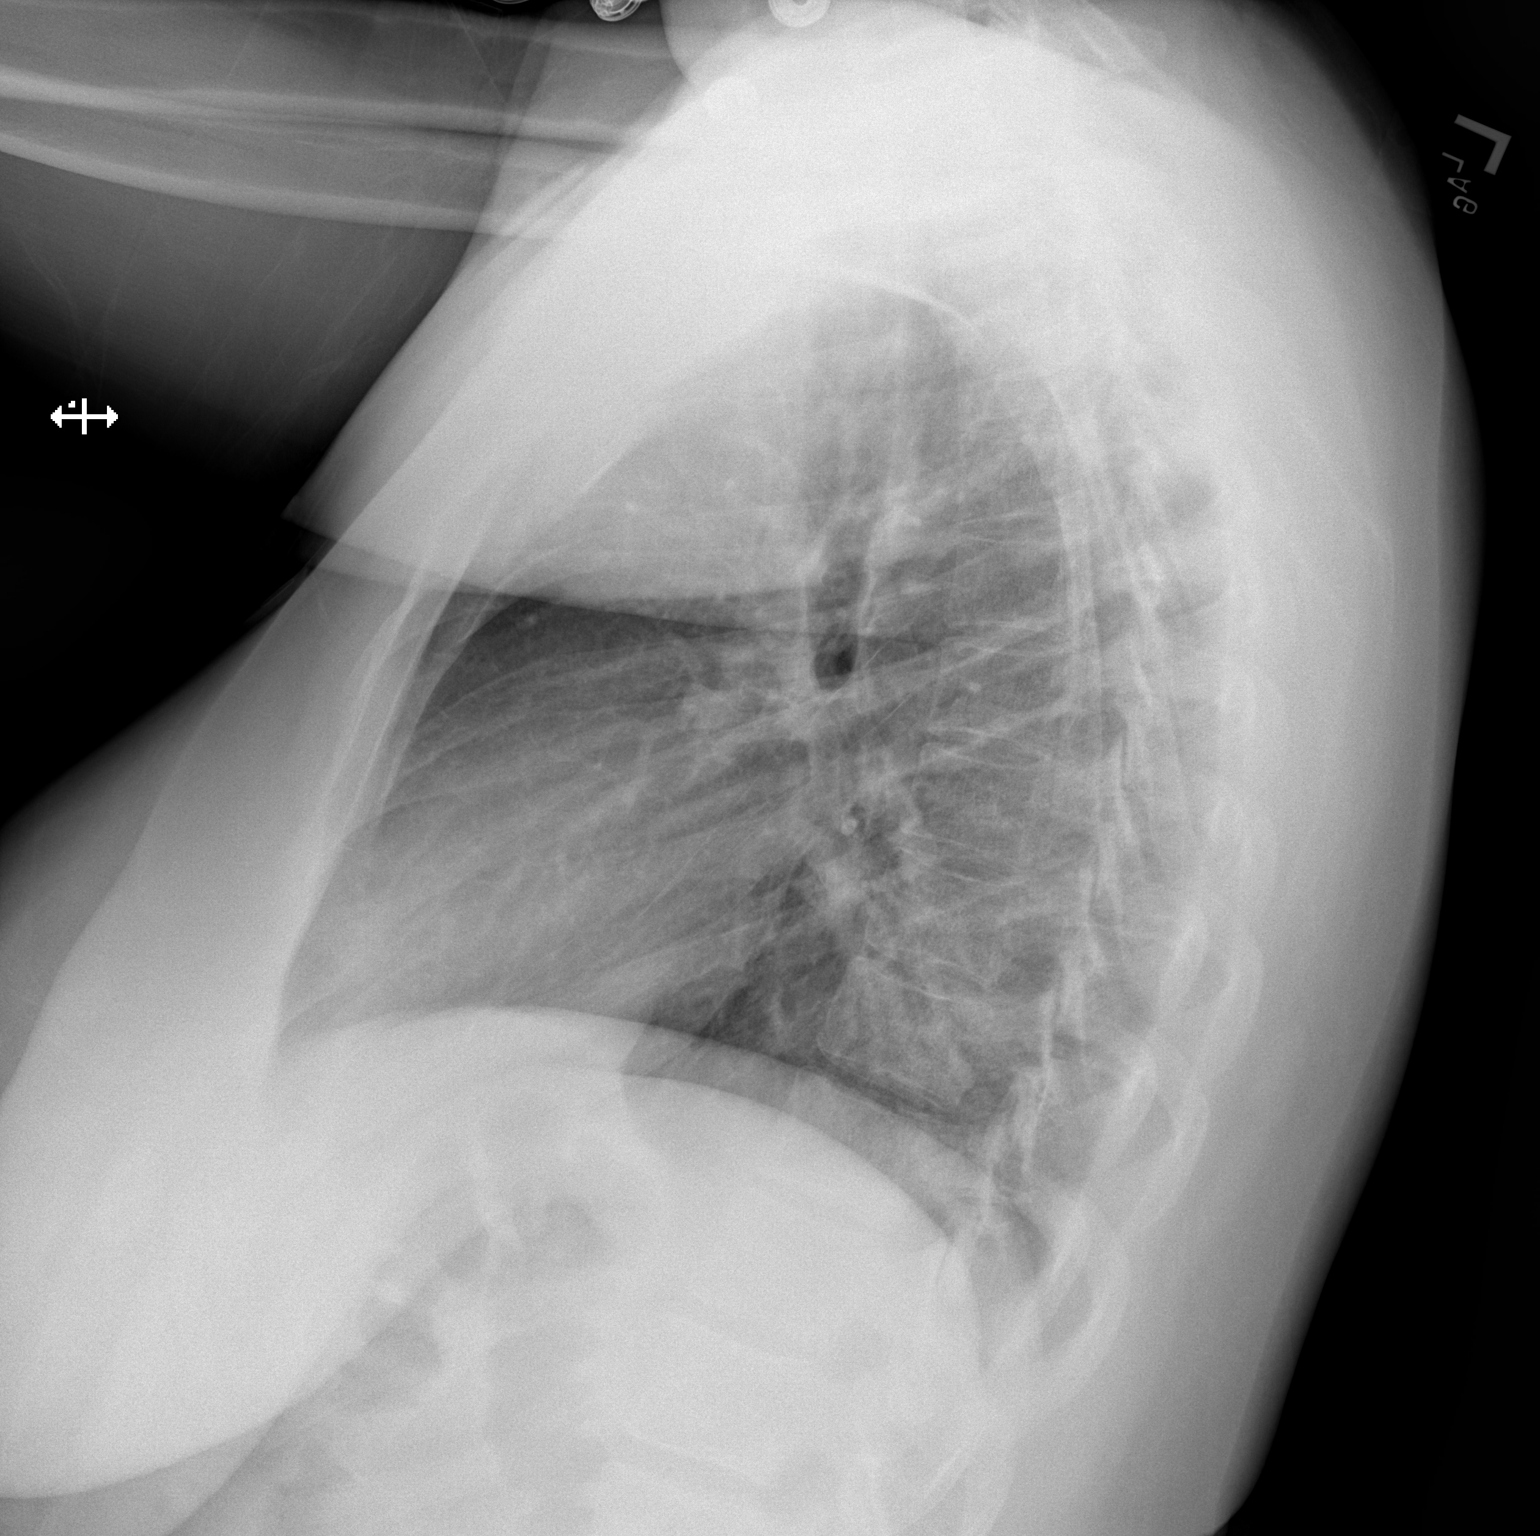

[2 of 2 positions shown; findings below may reference images not displayed]

FINDINGS: The heart size and mediastinal contours are within normal limits.
Both lungs are clear. The visualized skeletal structures are
unremarkable.
IMPRESSION: No active cardiopulmonary disease.

## 2022-03-19 ENCOUNTER — Encounter (HOSPITAL_COMMUNITY): Payer: Self-pay

## 2022-03-19 ENCOUNTER — Emergency Department (HOSPITAL_COMMUNITY)
Admission: EM | Admit: 2022-03-19 | Discharge: 2022-03-19 | Disposition: A | Discharge status: Home / Self Care | Payer: Medicaid Other | Attending: Emergency Medicine | Admitting: Emergency Medicine

## 2022-03-19 ENCOUNTER — Emergency Department (HOSPITAL_COMMUNITY): Payer: Medicaid Other

## 2022-03-19 DIAGNOSIS — I10 Essential (primary) hypertension: Secondary | ICD-10-CM | POA: Diagnosis not present

## 2022-03-19 DIAGNOSIS — Y92410 Unspecified street and highway as the place of occurrence of the external cause: Secondary | ICD-10-CM | POA: Diagnosis not present

## 2022-03-19 DIAGNOSIS — S299XXA Unspecified injury of thorax, initial encounter: Secondary | ICD-10-CM | POA: Diagnosis not present

## 2022-03-19 MED ORDER — ACETAMINOPHEN 325 MG PO TABS
650.0000 mg | ORAL_TABLET | Freq: Once | ORAL | Status: AC
  Administered 2022-03-19: 650 mg via ORAL
  Filled 2022-03-19: qty 2

## 2022-03-19 MED ORDER — LIDOCAINE 5 % EX PTCH
1.0000 | MEDICATED_PATCH | CUTANEOUS | Status: DC
  Administered 2022-03-19: 1 via TRANSDERMAL
  Filled 2022-03-19: qty 1

## 2022-03-19 MED ORDER — METHOCARBAMOL 500 MG PO TABS: 500.0000 mg | ORAL_TABLET | Freq: Three times a day (TID) | ORAL | 0 refills | Status: AC | PRN

## 2022-03-19 MED ORDER — LIDOCAINE 5 % EX PTCH: 1.0000 | MEDICATED_PATCH | CUTANEOUS | 0 refills | Status: AC

## 2022-03-19 MED ORDER — METHOCARBAMOL 500 MG PO TABS
500.0000 mg | ORAL_TABLET | Freq: Once | ORAL | Status: AC
  Administered 2022-03-19: 500 mg via ORAL
  Filled 2022-03-19: qty 1
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
## 2022-03-19 NOTE — Discharge Instructions (Addendum)
Take Tylenol and ibuprofen as needed for pain and soreness.  Additionally, there was a prescription that was sent to your pharmacy for a muscle relaxer called Robaxin.  Take this only as needed.  A prescription for lidocaine patches was sent as well.  Avoid having more than 1 of these patches on your skin at any given time.

## 2022-05-19 ENCOUNTER — Emergency Department: Payer: Medicaid Other

## 2022-05-19 ENCOUNTER — Other Ambulatory Visit: Payer: Self-pay

## 2022-05-19 DIAGNOSIS — R079 Chest pain, unspecified: Secondary | ICD-10-CM | POA: Diagnosis not present

## 2022-05-19 DIAGNOSIS — M546 Pain in thoracic spine: Secondary | ICD-10-CM | POA: Insufficient documentation

## 2022-05-19 LAB — TROPONIN I (HIGH SENSITIVITY): Troponin I (High Sensitivity): 3 ng/L (ref <18)

## 2022-05-19 LAB — CBC
HCT: 32.9 % — ABNORMAL LOW (ref 36.0–46.0)
Hemoglobin: 10.9 g/dL — ABNORMAL LOW (ref 12.0–15.0)
MCH: 25.2 pg — ABNORMAL LOW (ref 26.0–34.0)
MCHC: 33.1 g/dL (ref 30.0–36.0)
MCV: 76 fL — ABNORMAL LOW (ref 80.0–100.0)
Platelets: 225 10*3/uL (ref 150–400)
RBC: 4.33 MIL/uL (ref 3.87–5.11)
RDW: 15.5 % (ref 11.5–15.5)
WBC: 8.6 10*3/uL (ref 4.0–10.5)
nRBC: 0 % (ref 0.0–0.2)

## 2022-05-19 LAB — BASIC METABOLIC PANEL
Anion gap: 8 (ref 5–15)
BUN: 10 mg/dL (ref 6–20)
CO2: 25 mmol/L (ref 22–32)
Calcium: 9.5 mg/dL (ref 8.9–10.3)
Chloride: 105 mmol/L (ref 98–111)
Creatinine, Ser: 0.47 mg/dL (ref 0.44–1.00)
GFR, Estimated: >60 mL/min (ref >60)
Glucose, Bld: 102 mg/dL — ABNORMAL HIGH (ref 70–99)
Potassium: 3.8 mmol/L (ref 3.5–5.1)
Sodium: 138 mmol/L (ref 135–145)

## 2022-05-19 MED ORDER — DIPHENHYDRAMINE HCL 25 MG PO CAPS
50.0000 mg | ORAL_CAPSULE | Freq: Once | ORAL | Status: AC
  Administered 2022-05-19: 50 mg via ORAL
  Filled 2022-05-19: qty 2

## 2022-05-19 NOTE — ED Notes (Addendum)
When removing adhesive tape pt's arm appeared red and hives started appearing where tape previously was. Pt complains of itchiness, First nurse was notified.

## 2022-05-19 NOTE — ED Triage Notes (Signed)
Pt to ED from home for CP x 5 days. Pt also has itchy skin. Pt denies N/V/D. Pt CAOx4 and in no acute distress and ambulatory to triage.

## 2022-05-19 NOTE — ED Notes (Signed)
This RN was notified of the patients allergic reaction to tape; see MAR for intervention.

## 2022-05-20 ENCOUNTER — Emergency Department
Admission: EM | Admit: 2022-05-20 | Discharge: 2022-05-20 | Disposition: A | Discharge status: Home / Self Care | Payer: Medicaid Other | Attending: Emergency Medicine | Admitting: Emergency Medicine

## 2022-05-20 DIAGNOSIS — M546 Pain in thoracic spine: Secondary | ICD-10-CM

## 2022-05-20 DIAGNOSIS — R079 Chest pain, unspecified: Secondary | ICD-10-CM

## 2022-05-20 NOTE — Discharge Instructions (Addendum)
Please use ibuprofen (Motrin) up to 800 mg every 8 hours, naproxen (Naprosyn) up to 500 mg every 12 hours, and/or acetaminophen (Tylenol) up to 4 g/day for any continued pain 

## 2022-05-20 NOTE — ED Notes (Signed)
Pt Dc to home. Dc instructions reviewed with all questions answered. Pt verbalizes understanding. Pt ambulatory out of dept with steady gait 

## 2022-05-20 NOTE — ED Provider Notes (Signed)
[facility] Provider Note   Event Date/Time   First MD Initiated Contact with Patient 05/20/22 0016     (approximate) History  Chest Pain  HPI [name] is a 26 y.o. female with no stated past medical history who presents for a pain in the thoracic back and chest over the past 5 days that is intermittent, occurs approximately once every few hours for less than 5 minutes and resolve spontaneously.  Patient denies any exacerbating or relieving factors.  Patient denies any exertional worsening.  Patient denies any recent travel or sick contacts.  Patient denies any recent surgeries or prolonged incapacitation. ROS: Patient currently denies any vision changes, tinnitus, difficulty speaking, facial droop, sore throat, shortness of breath, abdominal pain, nausea/vomiting/diarrhea, dysuria, or weakness/numbness/paresthesias in any extremity   Physical Exam  Triage Vital Signs: ED Triage Vitals  Enc Vitals Group     BP 05/19/22 1920 (!) 145/98     Pulse Rate 05/19/22 1920 79     Resp 05/19/22 1920 16     Temp 05/19/22 1920 98.1 F (36.7 C)     Temp Source 05/19/22 1920 Oral     SpO2 05/19/22 1920 100 %     Weight 05/19/22 1915 175 lb (79.4 kg)     Height 05/19/22 1915 5\' 6"  (1.676 m)     Head Circumference --      Peak Flow --      Pain Score 05/19/22 1915 8     Pain Loc --      Pain Edu? --      Excl. in [place]? --    Most recent vital signs: Vitals:   05/19/22 1920 05/19/22 2247  BP: (!) 145/98 (!) 141/86  Pulse: 79 70  Resp: 16 17  Temp: 98.1 F (36.7 C)   SpO2: 100% 100%   General: Awake, oriented x4. CV:  Good peripheral perfusion.  Resp:  Normal effort.  Abd:  No distention.  Other:  Young adult overweight African-American female laying in bed in no acute distress. ED Results / Procedures / Treatments  Labs (all labs ordered are listed, but only abnormal results are displayed) Labs Reviewed  BASIC METABOLIC PANEL - Abnormal; Notable for  the following components:      Result Value   Glucose, Bld 102 (*)    All other components within normal limits  CBC - Abnormal; Notable for the following components:   Hemoglobin 10.9 (*)    HCT 32.9 (*)    MCV 76.0 (*)    MCH 25.2 (*)    All other components within normal limits  POC URINE PREG, ED  TROPONIN I (HIGH SENSITIVITY)  TROPONIN I (HIGH SENSITIVITY)   EKG ED ECG REPORT I, 05/21/22, the attending physician, personally viewed and interpreted this ECG. Date: 05/20/2022 EKG Time: 1917 Rate: 72 Rhythm: normal sinus rhythm QRS Axis: normal Intervals: normal ST/T Wave abnormalities: normal Narrative Interpretation: no evidence of acute ischemia RADIOLOGY ED MD interpretation: 2 view chest x-ray interpreted by me shows no evidence of acute abnormalities including no pneumonia, pneumothorax, or widened mediastinum -Agree with radiology assessment Official radiology report(s): DG Chest 2 View  Result Date: 05/19/2022 CLINICAL DATA:  Chest pain for several days, no known injury, initial encounter EXAM: CHEST - 2 VIEW COMPARISON:  03/19/2022 FINDINGS: The heart size and mediastinal contours are within normal limits. Both lungs are clear. The visualized skeletal structures are unremarkable. IMPRESSION: No active cardiopulmonary disease. Electronically Signed   By:  Alcide Clever M.D.   On: 05/19/2022 20:02   PROCEDURES: Critical Care performed: No .1-3 Lead EKG Interpretation  Performed by: Merwyn Katos, MD Authorized by: Merwyn Katos, MD     Interpretation: normal     ECG rate:  69   ECG rate assessment: normal     Rhythm: sinus rhythm     Ectopy: none     Conduction: normal    MEDICATIONS ORDERED IN ED: Medications  diphenhydrAMINE (BENADRYL) capsule 50 mg (50 mg Oral Given 05/19/22 2303)   IMPRESSION / MDM / ASSESSMENT AND PLAN / ED COURSE  I reviewed the triage vital signs and the nursing notes.                             The patient is on the  cardiac monitor to evaluate for evidence of arrhythmia and/or significant heart rate changes. Patient's presentation is most consistent with acute presentation with potential threat to life or bodily function. Workup: ECG, CXR, CBC, BMP, Troponin Findings: ECG: No overt evidence of STEMI. No evidence of Brugadas sign, delta wave, epsilon wave, significantly prolonged QTc, or malignant arrhythmia HS Troponin: Negative x1 Other Labs unremarkable for emergent problems. CXR: Without PTX, PNA, or widened mediastinum HEART Score: 2  Given History, Exam, and Workup I have low suspicion for ACS, Pneumothorax, Pneumonia, Pulmonary Embolus, Tamponade, Aortic Dissection or other emergent problem as a cause for this presentation.   Reassesment: Prior to discharge patients pain was controlled and they were well appearing.  Disposition:  Discharge. Strict return precautions discussed with patient with full understanding. Advised patient to follow up promptly with primary care provider    FINAL CLINICAL IMPRESSION(S) / ED DIAGNOSES   Final diagnoses:  Chest pain, unspecified type  Acute thoracic back pain, unspecified back pain laterality   Rx / DC Orders   ED Discharge Orders     None      Note:  This document was prepared using Dragon voice recognition software and may include unintentional dictation errors.   Merwyn Katos, MD 05/20/22 [phone]

## 2022-07-03 ENCOUNTER — Other Ambulatory Visit: Payer: Self-pay

## 2022-07-03 ENCOUNTER — Encounter: Payer: Self-pay | Admitting: *Deleted

## 2022-07-03 ENCOUNTER — Emergency Department
Admission: EM | Admit: 2022-07-03 | Discharge: 2022-07-03 | Disposition: A | Discharge status: Home / Self Care | Payer: Medicaid Other | Attending: Emergency Medicine | Admitting: Emergency Medicine

## 2022-07-03 DIAGNOSIS — S0181XA Laceration without foreign body of other part of head, initial encounter: Secondary | ICD-10-CM | POA: Diagnosis not present

## 2022-07-03 DIAGNOSIS — Y92410 Unspecified street and highway as the place of occurrence of the external cause: Secondary | ICD-10-CM | POA: Insufficient documentation

## 2022-07-03 DIAGNOSIS — S8011XA Contusion of right lower leg, initial encounter: Secondary | ICD-10-CM | POA: Insufficient documentation

## 2022-07-03 DIAGNOSIS — Z23 Encounter for immunization: Secondary | ICD-10-CM | POA: Insufficient documentation

## 2022-07-03 DIAGNOSIS — S0990XA Unspecified injury of head, initial encounter: Secondary | ICD-10-CM | POA: Diagnosis present

## 2022-07-03 DIAGNOSIS — S40011A Contusion of right shoulder, initial encounter: Secondary | ICD-10-CM | POA: Diagnosis not present

## 2022-07-03 MED ORDER — TETANUS-DIPHTH-ACELL PERTUSSIS 5-2.5-18.5 LF-MCG/0.5 IM SUSY
0.5000 mL | PREFILLED_SYRINGE | Freq: Once | INTRAMUSCULAR | Status: AC
  Administered 2022-07-03: 0.5 mL via INTRAMUSCULAR
  Filled 2022-07-03: qty 0.5

## 2022-07-03 MED ORDER — NAPROXEN 500 MG PO TABS: 500.0000 mg | ORAL_TABLET | Freq: Two times a day (BID) | ORAL | 0 refills | Status: DC

## 2022-07-03 MED ORDER — HYDROCODONE-ACETAMINOPHEN 5-325 MG PO TABS
1.0000 | ORAL_TABLET | Freq: Once | ORAL | Status: AC
  Administered 2022-07-03: 1 via ORAL
  Filled 2022-07-03: qty 1

## 2022-07-03 MED ORDER — METHOCARBAMOL 500 MG PO TABS: 500.0000 mg | ORAL_TABLET | Freq: Four times a day (QID) | ORAL | 0 refills | Status: DC

## 2022-07-03 NOTE — ED Notes (Addendum)
First Nurse Note: Patient to ED via [facility] from [place]. Patient denies LOC but has laceration above left eyebrow.   125/46 84 HR 100% RA 126 cbg
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
## 2023-03-05 ENCOUNTER — Emergency Department: Payer: Medicaid Other

## 2023-03-05 ENCOUNTER — Other Ambulatory Visit: Payer: Self-pay

## 2023-03-05 ENCOUNTER — Emergency Department
Admission: EM | Admit: 2023-03-05 | Discharge: 2023-03-06 | Disposition: A | Discharge status: Home / Self Care | Payer: Medicaid Other | Attending: Student in an Organized Health Care Education/Training Program | Admitting: Student in an Organized Health Care Education/Training Program

## 2023-03-05 DIAGNOSIS — Z3A00 Weeks of gestation of pregnancy not specified: Secondary | ICD-10-CM | POA: Diagnosis not present

## 2023-03-05 DIAGNOSIS — R109 Unspecified abdominal pain: Secondary | ICD-10-CM | POA: Diagnosis not present

## 2023-03-05 DIAGNOSIS — O26891 Other specified pregnancy related conditions, first trimester: Secondary | ICD-10-CM | POA: Diagnosis present

## 2023-03-05 DIAGNOSIS — O3680X0 Pregnancy with inconclusive fetal viability, not applicable or unspecified: Secondary | ICD-10-CM

## 2023-03-05 DIAGNOSIS — W010XXA Fall on same level from slipping, tripping and stumbling without subsequent striking against object, initial encounter: Secondary | ICD-10-CM | POA: Diagnosis not present

## 2023-03-05 LAB — URINALYSIS, ROUTINE W REFLEX MICROSCOPIC
Bilirubin Urine: NEGATIVE
Glucose, UA: NEGATIVE mg/dL
Hgb urine dipstick: NEGATIVE
Ketones, ur: NEGATIVE mg/dL
Leukocytes,Ua: NEGATIVE
Nitrite: NEGATIVE
Protein, ur: NEGATIVE mg/dL
Specific Gravity, Urine: 1.018 (ref 1.005–1.030)
pH: 5 (ref 5.0–8.0)

## 2023-03-05 LAB — CBC
HCT: 34.4 % — ABNORMAL LOW (ref 36.0–46.0)
Hemoglobin: 11.8 g/dL — ABNORMAL LOW (ref 12.0–15.0)
MCH: 28.6 pg (ref 26.0–34.0)
MCHC: 34.3 g/dL (ref 30.0–36.0)
MCV: 83.3 fL (ref 80.0–100.0)
Platelets: 274 10*3/uL (ref 150–400)
RBC: 4.13 MIL/uL (ref 3.87–5.11)
RDW: 12.2 % (ref 11.5–15.5)
WBC: 9.5 10*3/uL (ref 4.0–10.5)
nRBC: 0 % (ref 0.0–0.2)

## 2023-03-05 LAB — HCG, QUANTITATIVE, PREGNANCY: hCG, Beta Chain, Quant, S: 1065 m[IU]/mL — ABNORMAL HIGH (ref <5)

## 2023-03-05 LAB — COMPREHENSIVE METABOLIC PANEL
ALT: 11 U/L (ref 0–44)
AST: 13 U/L — ABNORMAL LOW (ref 15–41)
Albumin: 4.3 g/dL (ref 3.5–5.0)
Alkaline Phosphatase: 40 U/L (ref 38–126)
Anion gap: 11 (ref 5–15)
BUN: 10 mg/dL (ref 6–20)
CO2: 24 mmol/L (ref 22–32)
Calcium: 8.9 mg/dL (ref 8.9–10.3)
Chloride: 102 mmol/L (ref 98–111)
Creatinine, Ser: 0.62 mg/dL (ref 0.44–1.00)
GFR, Estimated: >60 mL/min (ref >60)
Glucose, Bld: 106 mg/dL — ABNORMAL HIGH (ref 70–99)
Potassium: 3.4 mmol/L — ABNORMAL LOW (ref 3.5–5.1)
Sodium: 137 mmol/L (ref 135–145)
Total Bilirubin: 0.6 mg/dL (ref 0.3–1.2)
Total Protein: 7.8 g/dL (ref 6.5–8.1)

## 2023-03-05 LAB — LIPASE, BLOOD: Lipase: 34 U/L (ref 11–51)

## 2023-03-05 LAB — POC URINE PREG, ED: Preg Test, Ur: NEGATIVE

## 2023-03-05 NOTE — ED Notes (Signed)
Preg Test POSITIVE. Previous result entered in error

## 2023-03-05 NOTE — ED Provider Notes (Signed)
[facility] Provider Note    Event Date/Time   First MD Initiated Contact with Patient 03/05/23 2104     (approximate)   History   Fall and Abdominal Pain   HPI  [name] is a 27 y.o. female recently found out that she is pregnant uncertain as to how far along she is presents to the ER for evaluation of right-sided abdominal pain after she had a mechanical fall tripping over her shoes.  She denies any vaginal discharge or bleeding.  No syncope.  Last menstrual cycle was on the 10th.  Denies any dysuria.     Physical Exam   Triage Vital Signs: ED Triage Vitals  Encounter Vitals Group     BP 03/05/23 2004 (!) 141/92     Systolic BP Percentile --      Diastolic BP Percentile --      Pulse Rate 03/05/23 2004 92     Resp 03/05/23 2004 18     Temp 03/05/23 2004 98.2 F (36.8 C)     Temp Source 03/05/23 2004 Oral     SpO2 03/05/23 2004 100 %     Weight 03/05/23 2002 170 lb (77.1 kg)     Height 03/05/23 2002 5\' 6"  (1.676 m)     Head Circumference --      Peak Flow --      Pain Score 03/05/23 2002 7     Pain Loc --      Pain Education --      Exclude from Growth Chart --     Most recent vital signs: Vitals:   03/05/23 2004  BP: (!) 141/92  Pulse: 92  Resp: 18  Temp: 98.2 F (36.8 C)  SpO2: 100%     Constitutional: Alert  Eyes: Conjunctivae are normal.  Head: Atraumatic. Nose: No congestion/rhinnorhea. Mouth/Throat: Mucous membranes are moist.   Neck: Painless ROM.  Cardiovascular:   Good peripheral circulation. Respiratory: Normal respiratory effort.  No retractions.  Gastrointestinal: Soft and nontender.  Musculoskeletal:  no deformity Neurologic:  MAE spontaneously. No gross focal neurologic deficits are appreciated.  Skin:  Skin is warm, dry and intact. No rash noted. Psychiatric: Mood and affect are normal. Speech and behavior are normal.    ED Results / Procedures / Treatments   Labs (all labs ordered are  listed, but only abnormal results are displayed) Labs Reviewed  COMPREHENSIVE METABOLIC PANEL - Abnormal; Notable for the following components:      Result Value   Potassium 3.4 (*)    Glucose, Bld 106 (*)    AST 13 (*)    All other components within normal limits  CBC - Abnormal; Notable for the following components:   Hemoglobin 11.8 (*)    HCT 34.4 (*)    All other components within normal limits  URINALYSIS, ROUTINE W REFLEX MICROSCOPIC - Abnormal; Notable for the following components:   Color, Urine YELLOW (*)    APPearance HAZY (*)    All other components within normal limits  HCG, QUANTITATIVE, PREGNANCY - Abnormal; Notable for the following components:   hCG, Beta Chain, Quant, S 1,065 (*)    All other components within normal limits  LIPASE, BLOOD  POC URINE PREG, ED     EKG     RADIOLOGY Please see ED Course for my review and interpretation.  I personally reviewed all radiographic images ordered to evaluate for the above acute complaints and reviewed radiology reports and findings.  These findings were  personally discussed with the patient.  Please see medical record for radiology report.    PROCEDURES:  Critical Care performed: No  Procedures   MEDICATIONS ORDERED IN ED: Medications - No data to display   IMPRESSION / MDM / ASSESSMENT AND PLAN / ED COURSE  I reviewed the triage vital signs and the nursing notes.                              Differential diagnosis includes, but is not limited to, contusion, musculoskeletal injury, ectopic, abruption, cyst  Patient presenting to the ER for evaluation of symptoms as described above.  Based on symptoms, risk factors and considered above differential, this presenting complaint could reflect a potentially life-threatening illness therefore the patient will be placed on continuous pulse oximetry and telemetry for monitoring.  Laboratory evaluation will be sent to evaluate for the above complaints.  Patient's  blood work is reassuring.  Quant level is in the 1000's.  She is not having any vaginal bleeding lower suspicion for ectopic.  Is not consistent with appendicitis no fevers not consistent with biliary pathology.  Given early pregnancy and abdominal discomfort will order ultrasound to further evaluate and [place] stratify for ectopic pregnancy.  She is already established with OB/GYN.  Patient will be signed out oncoming physician pending follow-up ultrasound.  Do anticipate patient will be appropriate for close outpatient follow-up.        FINAL CLINICAL IMPRESSION(S) / ED DIAGNOSES   Final diagnoses:  Abdominal pain in pregnancy, first trimester     Rx / DC Orders   ED Discharge Orders     None        Note:  This document was prepared using Dragon voice recognition software and may include unintentional dictation errors.    Willy Eddy, MD 03/05/23 2312

## 2023-03-05 NOTE — ED Triage Notes (Signed)
Pt to ED via POV c/o fall. Pt says she tripped over something and fell on ground. Pt reports she is pregnant, unknown how many weeks. Pt having right sided abd pain at this time. Denies CP, SOB, fevers

## 2023-03-06 NOTE — ED Provider Notes (Signed)
Patient received in signout from Dr. Roxan Hockey pending pelvic ultrasound.  Ultrasound with pregnancy of unknown location.  I assessed the patient and she has a benign abdominal exam without tenderness.  Less likely ectopic.  She is connected with OB as an outpatient with the [facility] clinic.  We discussed what pregnancy of unknown location represents, expectant management and ED return precautions.  She is suitable for outpatient management.   Delton Prairie, MD 03/06/23 [phone]

## 2023-03-30 ENCOUNTER — Emergency Department
Admission: EM | Admit: 2023-03-30 | Discharge: 2023-03-31 | Disposition: A | Discharge status: Home / Self Care | Payer: Medicaid Other | Attending: Emergency Medicine | Admitting: Emergency Medicine

## 2023-03-30 ENCOUNTER — Emergency Department: Payer: Medicaid Other

## 2023-03-30 DIAGNOSIS — O219 Vomiting of pregnancy, unspecified: Secondary | ICD-10-CM | POA: Insufficient documentation

## 2023-03-30 DIAGNOSIS — Z3A08 8 weeks gestation of pregnancy: Secondary | ICD-10-CM | POA: Insufficient documentation

## 2023-03-30 DIAGNOSIS — O26891 Other specified pregnancy related conditions, first trimester: Secondary | ICD-10-CM | POA: Diagnosis not present

## 2023-03-30 DIAGNOSIS — R110 Nausea: Secondary | ICD-10-CM | POA: Insufficient documentation

## 2023-03-30 DIAGNOSIS — O208 Other hemorrhage in early pregnancy: Secondary | ICD-10-CM | POA: Insufficient documentation

## 2023-03-30 LAB — CBC
HCT: 32.9 % — ABNORMAL LOW (ref 36.0–46.0)
Hemoglobin: 11.5 g/dL — ABNORMAL LOW (ref 12.0–15.0)
MCH: 28.1 pg (ref 26.0–34.0)
MCHC: 35 g/dL (ref 30.0–36.0)
MCV: 80.4 fL (ref 80.0–100.0)
Platelets: 208 10*3/uL (ref 150–400)
RBC: 4.09 MIL/uL (ref 3.87–5.11)
RDW: 12.3 % (ref 11.5–15.5)
WBC: 8 10*3/uL (ref 4.0–10.5)
nRBC: 0.3 % — ABNORMAL HIGH (ref 0.0–0.2)

## 2023-03-30 LAB — URINALYSIS, ROUTINE W REFLEX MICROSCOPIC
Bilirubin Urine: NEGATIVE
Glucose, UA: NEGATIVE mg/dL
Ketones, ur: NEGATIVE mg/dL
Nitrite: NEGATIVE
Protein, ur: NEGATIVE mg/dL
Specific Gravity, Urine: 1.021 (ref 1.005–1.030)
pH: 6 (ref 5.0–8.0)

## 2023-03-30 LAB — COMPREHENSIVE METABOLIC PANEL
ALT: 11 U/L (ref 0–44)
AST: 13 U/L — ABNORMAL LOW (ref 15–41)
Albumin: 4.1 g/dL (ref 3.5–5.0)
Alkaline Phosphatase: 31 U/L — ABNORMAL LOW (ref 38–126)
Anion gap: 10 (ref 5–15)
BUN: 6 mg/dL (ref 6–20)
CO2: 24 mmol/L (ref 22–32)
Calcium: 9.2 mg/dL (ref 8.9–10.3)
Chloride: 103 mmol/L (ref 98–111)
Creatinine, Ser: 0.46 mg/dL (ref 0.44–1.00)
GFR, Estimated: >60 mL/min (ref >60)
Glucose, Bld: 90 mg/dL (ref 70–99)
Potassium: 3.2 mmol/L — ABNORMAL LOW (ref 3.5–5.1)
Sodium: 137 mmol/L (ref 135–145)
Total Bilirubin: 0.8 mg/dL (ref 0.3–1.2)
Total Protein: 7.6 g/dL (ref 6.5–8.1)

## 2023-03-30 LAB — HCG, QUANTITATIVE, PREGNANCY: hCG, Beta Chain, Quant, S: 141640 m[IU]/mL — ABNORMAL HIGH (ref <5)

## 2023-03-30 LAB — POC URINE PREG, ED: Preg Test, Ur: POSITIVE — AB

## 2023-03-30 LAB — LIPASE, BLOOD: Lipase: 30 U/L (ref 11–51)

## 2023-03-30 MED ORDER — FOSFOMYCIN TROMETHAMINE 3 G PO PACK
3.0000 g | PACK | Freq: Once | ORAL | Status: AC
  Administered 2023-03-31: 3 g via ORAL
  Filled 2023-03-30: qty 3

## 2023-03-30 MED ORDER — POTASSIUM CHLORIDE CRYS ER 20 MEQ PO TBCR
40.0000 meq | EXTENDED_RELEASE_TABLET | Freq: Once | ORAL | Status: AC
  Administered 2023-03-31: 40 meq via ORAL
  Filled 2023-03-30: qty 2

## 2023-03-30 MED ORDER — ONDANSETRON 4 MG PO TBDP
4.0000 mg | ORAL_TABLET | Freq: Once | ORAL | Status: AC
  Administered 2023-03-31: 4 mg via ORAL
  Filled 2023-03-30: qty 1

## 2023-03-30 NOTE — ED Notes (Signed)
Dr. Forbach at bedside.  

## 2023-03-30 NOTE — ED Provider Notes (Signed)
[facility] Provider Note    Event Date/Time   First MD Initiated Contact with Patient 03/30/23 2320     (approximate)   History   Emesis   HPI [name] is a 27 y.o. female G2, P1 at unknown gestational age, possibly 4 to 6 weeks.  She presents for evaluation of 2 weeks of nausea and 2 episodes of vomiting starting today.  She said she is just tired of feeling the way she does, feeling nauseated all the time and worse after she eats or drinks anything.  She has also had some intermittent lower abdominal pain and cramping.  No vaginal bleeding.  No dysuria.  No chest pain nor shortness of breath.     Physical Exam   Triage Vital Signs: ED Triage Vitals  Encounter Vitals Group     BP 03/30/23 2049 (!) 145/80     Systolic BP Percentile --      Diastolic BP Percentile --      Pulse Rate 03/30/23 2049 64     Resp 03/30/23 2049 18     Temp 03/30/23 2049 97.7 F (36.5 C)     Temp Source 03/30/23 2049 Oral     SpO2 03/30/23 2049 100 %     Weight 03/30/23 2049 77.1 kg (170 lb)     Height 03/30/23 2047 1.676 m (5\' 6" )     Head Circumference --      Peak Flow --      Pain Score 03/30/23 2047 0     Pain Loc --      Pain Education --      Exclude from Growth Chart --     Most recent vital signs: Vitals:   03/30/23 2049  BP: (!) 145/80  Pulse: 64  Resp: 18  Temp: 97.7 F (36.5 C)  SpO2: 100%    General: Awake, no distress.  Well-appearing.  Ambulatory without difficulty. CV:  Good peripheral perfusion.  Regular rate and rhythm. Resp:  Normal effort. Speaking easily and comfortably, no accessory muscle usage nor intercostal retractions.   Abd:  No distention.  No tenderness to palpation of the abdomen.  Deferred GU exam.   ED Results / Procedures / Treatments   Labs (all labs ordered are listed, but only abnormal results are displayed) Labs Reviewed  COMPREHENSIVE METABOLIC PANEL - Abnormal; Notable for the following components:       Result Value   Potassium 3.2 (*)    AST 13 (*)    Alkaline Phosphatase 31 (*)    All other components within normal limits  CBC - Abnormal; Notable for the following components:   Hemoglobin 11.5 (*)    HCT 32.9 (*)    nRBC 0.3 (*)    All other components within normal limits  URINALYSIS, ROUTINE W REFLEX MICROSCOPIC - Abnormal; Notable for the following components:   Color, Urine YELLOW (*)    APPearance CLOUDY (*)    Hgb urine dipstick SMALL (*)    Leukocytes,Ua TRACE (*)    Bacteria, UA RARE (*)    All other components within normal limits  HCG, QUANTITATIVE, PREGNANCY - Abnormal; Notable for the following components:   hCG, Beta Chain, Quant, S 141,640 (*)    All other components within normal limits  POC URINE PREG, ED - Abnormal; Notable for the following components:   Preg Test, Ur Positive (*)    All other components within normal limits  URINE CULTURE  LIPASE, BLOOD  RADIOLOGY I viewed and interpreted the patient's GYN ultrasound.  Single intrauterine pregnancy.  Confirmed by radiologist, who also pointed out to small foci of subchorionic hemorrhage but no other emergent abnormalities.   PROCEDURES:  Critical Care performed: No  Procedures    IMPRESSION / MDM / ASSESSMENT AND PLAN / ED COURSE  I reviewed the triage vital signs and the nursing notes.                              Differential diagnosis includes, but is not limited to, ectopic pregnancy, molar pregnancy, normal nausea/vomiting associated with pregnancy, hyperemesis, UTI during pregnancy.  Patient's presentation is most consistent with acute presentation with potential threat to life or bodily function.  Labs/studies ordered: CBC, abdominal/pelvic ultrasound, urinalysis, hCG to confirm pregnancy and approximate gestational age, lipase, CMP, urine pregnancy.  Interventions/Medications given:  Medications  potassium chloride SA (KLOR-CON M) CR tablet 40 mEq (40 mEq Oral Given 03/31/23  0001)  ondansetron (ZOFRAN-ODT) disintegrating tablet 4 mg (4 mg Oral Given 03/31/23 0000)  fosfomycin (MONUROL) packet 3 g (3 g Oral Given 03/31/23 0005)    (Note:  hospital course my include additional interventions and/or labs/studies not listed above.)   Well-appearing, stable vitals, no distress at this time.  Patient appears to be more frustrated than acutely or severely ill.  However, gestational age is unknown and intrauterine pregnancy has not been previously verified.  We will verify with ultrasound.  Labs are all essentially normal other than mild hypokalemia for which I will order 40 mill equivalents by mouth.  Urinalysis appears contaminated with squamous epithelials but given that she is pregnant I will give a one-time dose of fosfomycin 3 g p.o.  I will also give her a dose of Zofran 4 mg ODT.     Clinical Course as of 03/31/23 0152  Mon Mar 31, 2023  0152 Generally reassuring ultrasound.  I discussed the findings with the patient including the small foci of subchorionic hemorrhage and verified again that she has not had any vaginal bleeding.  She already has an appointment for prenatal care at [facility] and will follow-up as planned.  I wrote her a prescription for Diclegis to help with the nausea during pregnancy.  She will follow-up and I gave my usual return precautions. [CF]    Clinical Course User Index [CF] Loleta Rose, MD     FINAL CLINICAL IMPRESSION(S) / ED DIAGNOSES   Final diagnoses:  Nausea and vomiting during pregnancy prior to [redacted] weeks gestation  Subchorionic hemorrhage in first trimester     Rx / DC Orders   ED Discharge Orders          Ordered    Doxylamine-Pyridoxine (DICLEGIS) 10-10 MG TBEC        03/31/23 0147             Note:  This document was prepared using Dragon voice recognition software and may include unintentional dictation errors.   Loleta Rose, MD 03/31/23 [phone]

## 2023-03-30 NOTE — ED Notes (Signed)
Patient transported to Ultrasound 

## 2023-03-30 NOTE — ED Notes (Signed)
Patient to return from [place]

## 2023-03-30 NOTE — ED Triage Notes (Signed)
Pt reports nausea for the last several weeks with emesis x2 today. Pt reports LMP the beginning of September. Pt reports intermittent abdominal pain.

## 2023-03-31 MED ORDER — DOXYLAMINE-PYRIDOXINE 10-10 MG PO TBEC
DELAYED_RELEASE_TABLET | ORAL | 1 refills | Status: DC
Start: 2023-03-31 — End: 2023-10-01

## 2023-03-31 NOTE — Discharge Instructions (Addendum)
As we discussed, your evaluation was generally reassuring.  You have a single intrauterine pregnancy at about 8 weeks and 1 day gestation.  You have 2 small areas of bleeding inside the uterus which is called subchorionic hemorrhage, and it is very common and does not necessarily mean that anything bad is going to happen with pregnancy.  Your OB/GYN provider will monitor this as you proceed with your pregnancy.  We recommend pelvic rest (no sexual intercourse) until cleared by your OB/GYN prenatal provider.  Try the prescribed medication for nausea during pregnancy and let your prenatal provider know how that is working when you follow-up with them in clinic.  Let them know we also gave you a one-time dose of an antibiotic called fosfomycin because of the possibility you might have a mild urinary tract infection.  A urine culture is pending as well.    Return to the emergency department if you develop new or worsening symptoms that concern you.

## 2023-04-01 LAB — URINE CULTURE

## 2023-04-19 ENCOUNTER — Encounter (HOSPITAL_COMMUNITY): Payer: Self-pay

## 2023-04-19 ENCOUNTER — Other Ambulatory Visit: Payer: Self-pay

## 2023-04-19 ENCOUNTER — Emergency Department (HOSPITAL_COMMUNITY)
Admission: EM | Admit: 2023-04-19 | Discharge: 2023-04-20 | Disposition: A | Discharge status: Home / Self Care | Payer: Medicaid Other | Attending: Student | Admitting: Student

## 2023-04-19 DIAGNOSIS — R1011 Right upper quadrant pain: Secondary | ICD-10-CM | POA: Diagnosis not present

## 2023-04-19 DIAGNOSIS — R1033 Periumbilical pain: Secondary | ICD-10-CM | POA: Insufficient documentation

## 2023-04-19 DIAGNOSIS — R109 Unspecified abdominal pain: Secondary | ICD-10-CM

## 2023-04-19 DIAGNOSIS — O30031 Twin pregnancy, monochorionic/diamniotic, first trimester: Secondary | ICD-10-CM

## 2023-04-19 DIAGNOSIS — E871 Hypo-osmolality and hyponatremia: Secondary | ICD-10-CM | POA: Insufficient documentation

## 2023-04-19 DIAGNOSIS — D72829 Elevated white blood cell count, unspecified: Secondary | ICD-10-CM | POA: Insufficient documentation

## 2023-04-19 DIAGNOSIS — Z3A10 10 weeks gestation of pregnancy: Secondary | ICD-10-CM | POA: Diagnosis not present

## 2023-04-19 DIAGNOSIS — N898 Other specified noninflammatory disorders of vagina: Secondary | ICD-10-CM

## 2023-04-19 DIAGNOSIS — O26891 Other specified pregnancy related conditions, first trimester: Secondary | ICD-10-CM | POA: Diagnosis present

## 2023-04-19 LAB — BASIC METABOLIC PANEL
Anion gap: 8 (ref 5–15)
BUN: 8 mg/dL (ref 6–20)
CO2: 22 mmol/L (ref 22–32)
Calcium: 9.2 mg/dL (ref 8.9–10.3)
Chloride: 103 mmol/L (ref 98–111)
Creatinine, Ser: 0.43 mg/dL — ABNORMAL LOW (ref 0.44–1.00)
GFR, Estimated: >60 mL/min (ref >60)
Glucose, Bld: 102 mg/dL — ABNORMAL HIGH (ref 70–99)
Potassium: 4 mmol/L (ref 3.5–5.1)
Sodium: 133 mmol/L — ABNORMAL LOW (ref 135–145)

## 2023-04-19 LAB — CBC WITH DIFFERENTIAL/PLATELET
Abs Immature Granulocytes: 0.04 10*3/uL (ref 0.00–0.07)
Basophils Absolute: 0 10*3/uL (ref 0.0–0.1)
Basophils Relative: 0 %
Eosinophils Absolute: 0.5 10*3/uL (ref 0.0–0.5)
Eosinophils Relative: 4 %
HCT: 32.3 % — ABNORMAL LOW (ref 36.0–46.0)
Hemoglobin: 11.3 g/dL — ABNORMAL LOW (ref 12.0–15.0)
Immature Granulocytes: 0 %
Lymphocytes Relative: 21 %
Lymphs Abs: 2.3 10*3/uL (ref 0.7–4.0)
MCH: 29 pg (ref 26.0–34.0)
MCHC: 35 g/dL (ref 30.0–36.0)
MCV: 83 fL (ref 80.0–100.0)
Monocytes Absolute: 0.7 10*3/uL (ref 0.1–1.0)
Monocytes Relative: 7 %
Neutro Abs: 7.3 10*3/uL (ref 1.7–7.7)
Neutrophils Relative %: 68 %
Platelets: 243 10*3/uL (ref 150–400)
RBC: 3.89 MIL/uL (ref 3.87–5.11)
RDW: 13 % (ref 11.5–15.5)
WBC: 10.8 10*3/uL — ABNORMAL HIGH (ref 4.0–10.5)
nRBC: 0 % (ref 0.0–0.2)

## 2023-04-19 NOTE — ED Triage Notes (Signed)
Pt arrived POV d/t lower ABD pain with nausea onset today.  Pt is [redacted] weeks pregnant but denies bleeding, discharge.

## 2023-04-20 ENCOUNTER — Emergency Department (HOSPITAL_COMMUNITY): Payer: Medicaid Other

## 2023-04-20 LAB — URINALYSIS, ROUTINE W REFLEX MICROSCOPIC
Bilirubin Urine: NEGATIVE
Glucose, UA: NEGATIVE mg/dL
Hgb urine dipstick: NEGATIVE
Ketones, ur: NEGATIVE mg/dL
Nitrite: NEGATIVE
Protein, ur: NEGATIVE mg/dL
Specific Gravity, Urine: 1.02 (ref 1.005–1.030)
pH: 5 (ref 5.0–8.0)

## 2023-04-20 LAB — HEPATIC FUNCTION PANEL
ALT: 14 U/L (ref 0–44)
AST: 15 U/L (ref 15–41)
Albumin: 3.9 g/dL (ref 3.5–5.0)
Alkaline Phosphatase: 33 U/L — ABNORMAL LOW (ref 38–126)
Bilirubin, Direct: <0.1 mg/dL (ref 0.0–0.2)
Total Bilirubin: 0.5 mg/dL (ref 0.3–1.2)
Total Protein: 7.7 g/dL (ref 6.5–8.1)

## 2023-04-20 LAB — ABO/RH: ABO/RH(D): AB POS

## 2023-04-20 LAB — HCG, QUANTITATIVE, PREGNANCY: hCG, Beta Chain, Quant, S: >250000 m[IU]/mL — ABNORMAL HIGH (ref <5)

## 2023-04-20 LAB — WET PREP, GENITAL
Clue Cells Wet Prep HPF POC: NONE SEEN
Sperm: NONE SEEN
Trich, Wet Prep: NONE SEEN
WBC, Wet Prep HPF POC: <10 (ref <10)
Yeast Wet Prep HPF POC: NONE SEEN

## 2023-04-20 MED ORDER — ACETAMINOPHEN 325 MG PO TABS
650.0000 mg | ORAL_TABLET | Freq: Once | ORAL | Status: AC
  Administered 2023-04-20: 650 mg via ORAL
  Filled 2023-04-20: qty 2

## 2023-04-20 NOTE — ED Provider Notes (Signed)
[place] EMERGENCY DEPARTMENT AT [facility] Provider Note   CSN: [id number] Arrival date & time: 04/19/23  2211     History  Chief Complaint  Patient presents with   Abdominal Pain    [name] is a 27 y.o. female who presents with concern for abdominal pain in context of pregnancy at [redacted] weeks gestation with twin pregnancy. G2P1. Denies vaginal bleeding and discharge, endorses nausea. Taking zofran per OBGYN. Right sided abdominal pain, tight in nature, x 24 hours. NO vomiting, fevers, chills, or urinary symptoms.   Hx of elevated BP in first pregnancy induced and underwent C-section ultimately.   HPI     Home Medications Prior to Admission medications   Medication Sig Start Date End Date Taking? Authorizing Provider  Doxylamine-Pyridoxine (DICLEGIS) 10-10 MG TBEC Two tablets at bedtime on day 1 and 2; if symptoms persist, take 1 tablet in morning and 2 tablets at bedtime on day 3; if symptoms persist, may increase to 1 tablet in morning, 1 tablet mid-afternoon, and 2 tablets at bedtime on day 4 for a maximum of 4 tablets per day. Use the minimum dose necessary to control your symptoms. 03/31/23  Yes Loleta Rose, MD  acetaminophen (TYLENOL) 500 MG tablet Take 2 tablets (1,000 mg total) by mouth every 6 (six) hours. Patient not taking: Reported on 04/20/2023 03/19/21   McVey, Lurena Joiner A, CNM  coconut oil OIL Apply 1 application topically as needed. Patient not taking: Reported on 04/20/2023 03/19/21   McVey, Prudencio Pair, CNM  medroxyPROGESTERone Acetate 150 MG/ML SUSY Inject 1 mL (150 mg total) into the muscle once for 1 dose. 03/19/21 03/19/21  Janyce Llanos, CNM  methocarbamol (ROBAXIN) 500 MG tablet Take 1 tablet (500 mg total) by mouth 4 (four) times daily. Patient not taking: Reported on 04/20/2023 07/03/22   Kem Boroughs B, FNP  naproxen (NAPROSYN) 500 MG tablet Take 1 tablet (500 mg total) by mouth 2 (two) times daily with a meal. Patient not  taking: Reported on 04/20/2023 07/03/22   Kem Boroughs B, FNP  NIFEdipine (ADALAT CC) 30 MG 24 hr tablet Take 1 tablet (30 mg total) by mouth daily. Patient not taking: Reported on 04/20/2023 03/20/21   McVey, Prudencio Pair, CNM  senna-docusate (SENOKOT-S) 8.6-50 MG tablet Take 2 tablets by mouth daily. Patient not taking: Reported on 04/20/2023 03/19/21   McVey, Prudencio Pair, CNM  simethicone (MYLICON) 80 MG chewable tablet Chew 1 tablet (80 mg total) by mouth 3 (three) times daily after meals. Patient not taking: Reported on 04/20/2023 03/19/21   McVey, Prudencio Pair, CNM      Allergies    Tape    Review of Systems   Review of Systems  Gastrointestinal:  Positive for abdominal pain and nausea.    Physical Exam Updated Vital Signs BP (!) 148/77   Pulse 74   Temp (!) 97.1 F (36.2 C) (Oral)   Resp 19   Ht 5\' 6"  (1.676 m)   Wt 78.9 kg   LMP 02/25/2023 (Approximate)   SpO2 99%   BMI 28.08 kg/m  Physical Exam Vitals and nursing note reviewed.  Constitutional:      Appearance: She is obese. She is not ill-appearing or toxic-appearing.  HENT:     Head: Normocephalic and atraumatic.     Mouth/Throat:     Mouth: Mucous membranes are moist.     Pharynx: No oropharyngeal exudate or posterior oropharyngeal erythema.  Eyes:     General:  Right eye: No discharge.        Left eye: No discharge.     Conjunctiva/sclera: Conjunctivae normal.  Cardiovascular:     Rate and Rhythm: Normal rate and regular rhythm.     Pulses: Normal pulses.     Heart sounds: Normal heart sounds. No murmur heard. Pulmonary:     Effort: Pulmonary effort is normal. No respiratory distress.     Breath sounds: Normal breath sounds. No wheezing or rales.  Abdominal:     General: Bowel sounds are normal. There is no distension.     Palpations: Abdomen is soft.     Tenderness: There is abdominal tenderness in the right upper quadrant and periumbilical area. There is no right CVA tenderness, left CVA tenderness,  guarding or rebound. Negative signs include Murphy's sign, Rovsing's sign, McBurney's sign, psoas sign and obturator sign.  Musculoskeletal:        General: No deformity.     Cervical back: Neck supple.  Skin:    General: Skin is warm and dry.  Neurological:     Mental Status: She is alert. Mental status is at baseline.  Psychiatric:        Mood and Affect: Mood normal.     ED Results / Procedures / Treatments   Labs (all labs ordered are listed, but only abnormal results are displayed) Labs Reviewed  CBC WITH DIFFERENTIAL/PLATELET - Abnormal; Notable for the following components:      Result Value   WBC 10.8 (*)    Hemoglobin 11.3 (*)    HCT 32.3 (*)    All other components within normal limits  HCG, QUANTITATIVE, PREGNANCY - Abnormal; Notable for the following components:   hCG, Beta Chain, Quant, S >250,000 (*)    All other components within normal limits  BASIC METABOLIC PANEL - Abnormal; Notable for the following components:   Sodium 133 (*)    Glucose, Bld 102 (*)    Creatinine, Ser 0.43 (*)    All other components within normal limits  HEPATIC FUNCTION PANEL - Abnormal; Notable for the following components:   Alkaline Phosphatase 33 (*)    All other components within normal limits  URINALYSIS, ROUTINE W REFLEX MICROSCOPIC - Abnormal; Notable for the following components:   Leukocytes,Ua TRACE (*)    Bacteria, UA RARE (*)    All other components within normal limits  WET PREP, GENITAL  ABO/RH  GC/CHLAMYDIA PROBE AMP ([place]) NOT AT [facility]    EKG None  Radiology US OB Comp Less 14 Wks  Result Date: 04/20/2023 CLINICAL DATA:  Abdominal pain. EXAM: TWIN OBSTETRICAL ULTRASOUND <14 WKS TECHNIQUE: Transabdominal ultrasound was performed for evaluation of the gestation as well as the maternal uterus and adnexal regions. Patient declined transvaginal exam. COMPARISON:  03/30/2023. FINDINGS: Number of IUPs:  2 Chorionicity/Amnionicity:  Monochorionic/diamniotic TWIN  1 Yolk sac:  Yes Embryo:  Yes Cardiac Activity: Yes Heart Rate: 157 bpm CRL:   5.3 mm   12 w 0 d                  [place] EDC: 11/02/2023 TWIN 2 Yolk sac:  No Embryo:  Yes Cardiac Activity: Yes Heart Rate: 159 bpm CRL:   5.5 mm   12 w 1 d                  [place] EDC: 11/01/2023 Subchorionic hemorrhage: 2 possible small subchorionic hemorrhages are noted measuring 2.9 x 0.9 x 1.6 cm and 1.5 x 1.3  x 1.6 cm. Maternal uterus/adnexae: The ovaries are within normal limits. No free fluid in the pelvis. Evaluation of crown-rump length is limited due to curved position of the fetus. IMPRESSION: 1. Twin live intrauterine pregnancies with estimated gestational ages 28 weeks 0 days and 12 weeks 1 day. 2. Possible small subchorionic hemorrhages measuring up to 2.9 cm. Attention on follow-up is recommended. Electronically Signed   By: Thornell Sartorius M.D.   On: 04/20/2023 03:20   US Abdomen Limited RUQ (LIVER/GB)  Result Date: 04/20/2023 CLINICAL DATA:  Right upper quadrant abdominal pain EXAM: ULTRASOUND ABDOMEN LIMITED RIGHT UPPER QUADRANT COMPARISON:  CT abdomen and pelvis 02/23/2020 FINDINGS: Gallbladder: No gallstones or wall thickening visualized. No sonographic Murphy sign noted by sonographer. Common bile duct: Diameter: 3 mm.  No intrahepatic biliary dilation. Liver: No focal lesion identified. Within normal limits in parenchymal echogenicity. Portal vein is patent on color Doppler imaging with normal direction of blood flow towards the liver. Other: None. IMPRESSION: Unremarkable right upper quadrant ultrasound. Electronically Signed   By: Minerva Fester M.D.   On: 04/20/2023 03:04    Procedures Procedures    Medications Ordered in ED Medications  acetaminophen (TYLENOL) tablet 650 mg (650 mg Oral Given 04/20/23 0511)    ED Course/ Medical Decision Making/ A&P Clinical Course as of 04/20/23 0716  Sun Apr 20, 2023  0508 Case discussed with CNM Donato Schultz at [facility], patient's primary OB/GYN who  recommends UA wet prep GC/chlamydia for further workup of abdominal pain in pregnancy.  Also states that if patient's not having contractions and her cervix is long and closed she may follow-up in the outpatient setting with her OB/GYN.  Patient did refuse transvaginal aspect of OB ultrasound. [RS]    Clinical Course User Index [RS] [place] Virrueta, Eugene Gavia, PA-C                                 Medical Decision Making 27 year old female currently G2, P1 at approximately [redacted] weeks gestation with twin pregnancy who presents with abdominal pain.  Vital signs are reassuring intake.  Cardiopulmonary and abdominal exams are as above with some right-sided abdominal pain on exam though tenderness is mild.  No Murphy sign.    Amount and/or Complexity of Data Reviewed Labs: ordered.    Details: CBC with leukocytosis of 10.  Anemia of 11.3 near patient's baseline.  BMP with mild hyponatremia at 133.  UA unremarkable hepatic function panel reassuring.  hCG greater than 250,000.  Radiology: ordered.    Details:  OB ultrasound with live twin intrauterine pregnancies with possible small subchorionic hemorrhage of 2.9 cm.  Right upper quadrant ultrasound unremarkable.  Risk OTC drugs.   Patient initially refused transvaginal portion of OB ultrasound, however after discussion with patient CNM patient amenable to plan for transvaginal ultrasound for cervical assessment. Wet prep GC chlamydia pending at time of shift change.  As well as transvaginal portion of the [place].    Care of this patient signed out to oncoming ED provider L. Roemhildt, PA-C at time of shift change. All pertinent HPI, physical exam, and laboratory findings were discussed with them prior to my departure. Disposition of patient pending completion of workup, reevaluation, and clinical judgement of oncoming ED provider.   This chart was dictated using voice recognition software, Dragon. Despite the best efforts of this provider to proofread and  correct errors, errors may still occur which can change documentation meaning.  Final Clinical Impression(s) / ED Diagnoses Final diagnoses:  None    Rx / DC Orders ED Discharge Orders     None         Sherrilee Gilles 04/20/23 0716    Glendora Score, MD 04/21/23 [phone]

## 2023-04-20 NOTE — Discharge Instructions (Signed)
You were seen in the [facility] department for abdominal pain.  As we discussed your blood work, swabs, and ultrasound were all reassuring today.  Your OB/GYN would like to follow-up with you in clinic.  I recommend calling their office first thing tomorrow to schedule follow-up appointment.  Continue to monitor how you're doing and return to the ER for new or worsening symptoms.

## 2023-04-20 NOTE — ED Provider Notes (Signed)
Accepted handoff at shift change from [facility]. Please see prior provider note for full HPI.  Briefly: Patient is a 27 y.o. female who presents to the ER for R sided abdominal pain. [redacted] weeks pregnant, known twin pregnancy. Labs normal, abdominal US normal.   DDX/Plan: Pt had refused transvaginal US originally, but agreed to have it done after OBGYN recommendation. Pending wet prep and [place] results. Suspect pain from round ligament pain.  Physical Exam  BP 117/66 (BP Location: Right Arm)   Pulse 81   Temp 98.4 F (36.9 C) (Oral)   Resp 16   Ht 5\' 6"  (1.676 m)   Wt 78.9 kg   LMP 02/25/2023 (Approximate)   SpO2 97%   BMI 28.08 kg/m   Physical Exam Vitals and nursing note reviewed.  Constitutional:      Appearance: Normal appearance.  HENT:     Head: Normocephalic and atraumatic.  Eyes:     Conjunctiva/sclera: Conjunctivae normal.  Pulmonary:     Effort: Pulmonary effort is normal. No respiratory distress.  Skin:    General: Skin is warm and dry.  Neurological:     Mental Status: She is alert.  Psychiatric:        Mood and Affect: Mood normal.        Behavior: Behavior normal.    ED Course / MDM   Clinical Course as of 04/20/23 0817  Sun Apr 20, 2023  0508 Case discussed with CNM Donato Schultz at [facility], patient's primary OB/GYN who recommends UA wet prep GC/chlamydia for further workup of abdominal pain in pregnancy.  Also states that if patient's not having contractions and her cervix is long and closed she may follow-up in the outpatient setting with her OB/GYN.  Patient did refuse transvaginal aspect of OB ultrasound. [RS]  [phone] Discussed transvaginal US results with radiologist Dr Llana Aliment, reports cervix is closed and is about 3.7 cm in length [LR]    Clinical Course User Index [LR] Chancie Lampert T, PA-C [RS] Sponseller, Eugene Gavia, PA-C   Medical Decision Making Amount and/or Complexity of Data Reviewed Labs: ordered. Radiology:  ordered.  Risk OTC drugs.   Discussed [place] results with radiologist Dr Llana Aliment.  Discussed results with patient. Wet prep negative, [place] reassuring. Patient stable for dc to home with OBGYN follow up. Patient agreeable.    Su Monks, PA-C 04/20/23 1191    Rozelle Logan, DO 04/20/23 0840

## 2023-04-21 LAB — GC/CHLAMYDIA PROBE AMP (~~LOC~~) NOT AT ARMC
Chlamydia: NEGATIVE
Comment: NEGATIVE
Comment: NORMAL
Neisseria Gonorrhea: NEGATIVE

## 2023-04-30 DIAGNOSIS — O0993 Supervision of high risk pregnancy, unspecified, third trimester: Secondary | ICD-10-CM | POA: Insufficient documentation

## 2023-05-06 ENCOUNTER — Other Ambulatory Visit: Payer: Self-pay | Admitting: Certified Nurse Midwife

## 2023-05-06 ENCOUNTER — Encounter: Payer: Self-pay | Admitting: Certified Nurse Midwife

## 2023-05-06 DIAGNOSIS — O30041 Twin pregnancy, dichorionic/diamniotic, first trimester: Secondary | ICD-10-CM

## 2023-05-06 DIAGNOSIS — Z363 Encounter for antenatal screening for malformations: Secondary | ICD-10-CM

## 2023-05-26 ENCOUNTER — Other Ambulatory Visit: Payer: Self-pay | Admitting: Certified Nurse Midwife

## 2023-05-26 ENCOUNTER — Telehealth: Payer: Self-pay

## 2023-05-26 DIAGNOSIS — Z363 Encounter for antenatal screening for malformations: Secondary | ICD-10-CM

## 2023-05-26 DIAGNOSIS — O30041 Twin pregnancy, dichorionic/diamniotic, first trimester: Secondary | ICD-10-CM

## 2023-06-02 DIAGNOSIS — O09899 Supervision of other high risk pregnancies, unspecified trimester: Secondary | ICD-10-CM

## 2023-06-02 DIAGNOSIS — O30043 Twin pregnancy, dichorionic/diamniotic, third trimester: Secondary | ICD-10-CM | POA: Diagnosis present

## 2023-06-02 DIAGNOSIS — Z2839 Other underimmunization status: Secondary | ICD-10-CM

## 2023-06-30 ENCOUNTER — Other Ambulatory Visit: Payer: Medicaid Other

## 2023-07-09 ENCOUNTER — Other Ambulatory Visit: Payer: Self-pay

## 2023-07-09 ENCOUNTER — Other Ambulatory Visit
Admission: RE | Admit: 2023-07-09 | Discharge: 2023-07-09 | Disposition: A | Discharge status: Home / Self Care | Payer: Medicaid Other | Source: Ambulatory Visit | Attending: Obstetrics | Admitting: Obstetrics

## 2023-07-09 ENCOUNTER — Other Ambulatory Visit: Payer: Self-pay | Admitting: Obstetrics

## 2023-07-09 ENCOUNTER — Ambulatory Visit: Payer: Medicaid Other

## 2023-07-09 ENCOUNTER — Ambulatory Visit: Payer: Medicaid Other | Admitting: Obstetrics

## 2023-07-09 ENCOUNTER — Ambulatory Visit (HOSPITAL_BASED_OUTPATIENT_CLINIC_OR_DEPARTMENT_OTHER): Payer: Medicaid Other

## 2023-07-09 DIAGNOSIS — Z3482 Encounter for supervision of other normal pregnancy, second trimester: Secondary | ICD-10-CM

## 2023-07-09 DIAGNOSIS — O30042 Twin pregnancy, dichorionic/diamniotic, second trimester: Secondary | ICD-10-CM | POA: Diagnosis not present

## 2023-07-09 DIAGNOSIS — Z363 Encounter for antenatal screening for malformations: Secondary | ICD-10-CM | POA: Insufficient documentation

## 2023-07-09 DIAGNOSIS — Z3A22 22 weeks gestation of pregnancy: Secondary | ICD-10-CM | POA: Diagnosis not present

## 2023-07-09 DIAGNOSIS — O99352 Diseases of the nervous system complicating pregnancy, second trimester: Secondary | ICD-10-CM

## 2023-07-09 DIAGNOSIS — O30041 Twin pregnancy, dichorionic/diamniotic, first trimester: Secondary | ICD-10-CM

## 2023-07-09 DIAGNOSIS — G40909 Epilepsy, unspecified, not intractable, without status epilepticus: Secondary | ICD-10-CM | POA: Diagnosis not present

## 2023-07-09 DIAGNOSIS — O09292 Supervision of pregnancy with other poor reproductive or obstetric history, second trimester: Secondary | ICD-10-CM

## 2023-07-09 DIAGNOSIS — O365922 Maternal care for other known or suspected poor fetal growth, second trimester, fetus 2: Secondary | ICD-10-CM

## 2023-07-09 DIAGNOSIS — O34219 Maternal care for unspecified type scar from previous cesarean delivery: Secondary | ICD-10-CM

## 2023-07-09 NOTE — Progress Notes (Signed)
 MFM Note  [name] is currently 22 weeks and 4 days.  She was seen at was seen due to a spontaneously conceived twin pregnancy.    She has not had a screening test drawn to screen for fetal aneuploidy.  A thick dividing membrane was noted separating the two fetuses, indicating that these are dichorionic, diamniotic twins.  The fetal growth for both twin A and twin B appeared in the lower normal range today.  There was normal amniotic fluid noted around both fetuses.  The patient's [facility] of Nov 08, 2023 was confirmed via a first trimester ultrasound.  Doppler studies of the umbilical arteries performed for both fetuses today continues to show normal forward flow.  There were no signs of absent or reversed end-diastolic flow noted in either fetus today.  There were no obvious fetal anomalies noted in twin A.  An echogenic focus was noted in twin B.   The small association between an echogenic focus and Down syndrome was discussed.   Due to the echogenic focus noted today, the patient will have a cell free DNA test (Panorama) drawn following today's ultrasound exam to screen for fetal aneuploidy.  Our genetic counselor will inform the patient regarding the results of this test.  The views of the fetal anatomy were limited today due to the fetal positions.  The limitations of ultrasound in the detection of all anomalies was discussed today.  The management of dichorionic twins was discussed.  She was advised that management of twin pregnancies will involve frequent ultrasound exams to assess the fetal growth and amniotic fluid level.   As the fetal growth for both twin A and twin B were in the lower normal range today, she will return to our office in 3 weeks for another growth scan.    She understands that should IUGR be noted later in her pregnancy, we will continue to follow her with weekly umbilical artery Doppler studies and fetal testing at that time.   Delivery for  uncomplicated dichorionic twins should occur at around 38 weeks.    However should IUGR be noted, as long as the fetal testing is within normal limits, delivery will usually be recommended at between 36 to 37 weeks.  The increased risk of preeclampsia, gestational diabetes, and preterm birth/labor associated with twin pregnancies was discussed.  She was advised to continue taking a daily baby aspirin for preeclampsia prophylaxis.    A follow-up exam was scheduled in 3 weeks to assess the fetal growth.    The patient stated that all of her questions were answered today.  A total of 45 inutes was spent counseling and coordinating the care for this patient.  Greater than 50% of the time was spent in direct face-to-face contact.

## 2023-07-14 ENCOUNTER — Other Ambulatory Visit: Payer: Self-pay | Admitting: *Deleted

## 2023-07-14 DIAGNOSIS — O30042 Twin pregnancy, dichorionic/diamniotic, second trimester: Secondary | ICD-10-CM

## 2023-07-17 ENCOUNTER — Telehealth: Payer: Self-pay

## 2023-07-17 LAB — PANORAMA PRENATAL TEST FULL PANEL:PANORAMA TEST PLUS 5 ADDITIONAL MICRODELETIONS: FETAL FRACTION: 14.9

## 2023-07-17 NOTE — Telephone Encounter (Signed)
Call could not be completed at this time. Was not able to leave message to discuss NIPS results.

## 2023-07-22 ENCOUNTER — Telehealth: Payer: Self-pay

## 2023-07-22 NOTE — Telephone Encounter (Signed)
Call could not be completed at this time. Was not able to leave message to discuss NIPS results.

## 2023-07-23 ENCOUNTER — Telehealth: Payer: Self-pay | Admitting: Obstetrics and Gynecology

## 2023-07-23 ENCOUNTER — Other Ambulatory Visit: Payer: Self-pay | Admitting: *Deleted

## 2023-07-23 NOTE — Telephone Encounter (Signed)
We have been unable to reach [name] by phone to review the results of her Panorama aneuploidy screening.  Those results are low risk and showed the twin pregnancy to be monozygotic (identical).  See the laboratory report for additional details.  We are available to answer any questions at [phone].  Cherly Anderson, MS, CGC

## 2023-07-24 DIAGNOSIS — O09299 Supervision of pregnancy with other poor reproductive or obstetric history, unspecified trimester: Secondary | ICD-10-CM | POA: Insufficient documentation

## 2023-07-24 DIAGNOSIS — O30009 Twin pregnancy, unspecified number of placenta and unspecified number of amniotic sacs, unspecified trimester: Secondary | ICD-10-CM | POA: Insufficient documentation

## 2023-07-24 DIAGNOSIS — O468X9 Other antepartum hemorrhage, unspecified trimester: Secondary | ICD-10-CM | POA: Insufficient documentation

## 2023-07-24 DIAGNOSIS — O34219 Maternal care for unspecified type scar from previous cesarean delivery: Secondary | ICD-10-CM | POA: Insufficient documentation

## 2023-07-24 DIAGNOSIS — O418X90 Other specified disorders of amniotic fluid and membranes, unspecified trimester, not applicable or unspecified: Secondary | ICD-10-CM | POA: Insufficient documentation

## 2023-07-31 ENCOUNTER — Other Ambulatory Visit: Payer: Self-pay | Admitting: Obstetrics

## 2023-07-31 ENCOUNTER — Ambulatory Visit: Payer: Medicaid Other | Admitting: *Deleted

## 2023-07-31 ENCOUNTER — Ambulatory Visit: Payer: Medicaid Other | Attending: Obstetrics and Gynecology

## 2023-07-31 ENCOUNTER — Ambulatory Visit (HOSPITAL_BASED_OUTPATIENT_CLINIC_OR_DEPARTMENT_OTHER): Payer: Medicaid Other | Admitting: Obstetrics

## 2023-07-31 ENCOUNTER — Other Ambulatory Visit: Payer: Self-pay | Admitting: *Deleted

## 2023-07-31 VITALS — BP 135/63 | HR 83

## 2023-07-31 DIAGNOSIS — O365921 Maternal care for other known or suspected poor fetal growth, second trimester, fetus 1: Secondary | ICD-10-CM | POA: Insufficient documentation

## 2023-07-31 DIAGNOSIS — O30042 Twin pregnancy, dichorionic/diamniotic, second trimester: Secondary | ICD-10-CM | POA: Insufficient documentation

## 2023-07-31 DIAGNOSIS — O34219 Maternal care for unspecified type scar from previous cesarean delivery: Secondary | ICD-10-CM | POA: Insufficient documentation

## 2023-07-31 DIAGNOSIS — O365922 Maternal care for other known or suspected poor fetal growth, second trimester, fetus 2: Secondary | ICD-10-CM

## 2023-07-31 DIAGNOSIS — O99352 Diseases of the nervous system complicating pregnancy, second trimester: Secondary | ICD-10-CM | POA: Diagnosis not present

## 2023-07-31 DIAGNOSIS — G40909 Epilepsy, unspecified, not intractable, without status epilepticus: Secondary | ICD-10-CM | POA: Insufficient documentation

## 2023-07-31 DIAGNOSIS — Z3A25 25 weeks gestation of pregnancy: Secondary | ICD-10-CM | POA: Insufficient documentation

## 2023-07-31 DIAGNOSIS — O30049 Twin pregnancy, dichorionic/diamniotic, unspecified trimester: Secondary | ICD-10-CM

## 2023-07-31 DIAGNOSIS — O418X90 Other specified disorders of amniotic fluid and membranes, unspecified trimester, not applicable or unspecified: Secondary | ICD-10-CM

## 2023-07-31 DIAGNOSIS — O09299 Supervision of pregnancy with other poor reproductive or obstetric history, unspecified trimester: Secondary | ICD-10-CM

## 2023-07-31 DIAGNOSIS — O09292 Supervision of pregnancy with other poor reproductive or obstetric history, second trimester: Secondary | ICD-10-CM | POA: Insufficient documentation

## 2023-07-31 DIAGNOSIS — Z362 Encounter for other antenatal screening follow-up: Secondary | ICD-10-CM | POA: Diagnosis not present

## 2023-07-31 DIAGNOSIS — O99891 Other specified diseases and conditions complicating pregnancy: Secondary | ICD-10-CM

## 2023-07-31 DIAGNOSIS — O365920 Maternal care for other known or suspected poor fetal growth, second trimester, not applicable or unspecified: Secondary | ICD-10-CM

## 2023-07-31 NOTE — Progress Notes (Signed)
 MFM Consult Note  [name] is currently at 25 weeks and 5 days.  She has been followed due to a spontaneously conceived monozygotic, dichorionic, diamniotic twin gestation.  Her cell free DNA test indicated a low risk for trisomy 21, 18, 13.  Two female fetuses are predicted and the test confirmed that these are monozygotic twins.  Twin A: EFW 1 pound 10 ounces (9th percentile) indicating IUGR.  Normal amniotic fluid.  Twin B: EFW 1 pound 9 ounces (6th percentile) indicating IUGR.  Normal amniotic fluid.  Doppler studies of the umbilical arteries performed for both fetuses today continues to show normal forward flow.  There were no signs of absent or reversed end-diastolic flow noted in either fetus today.  The management of IUGR in monozygotic, dichorionic twins was discussed.   Due to IUGR of both fetuses, we will continue to follow her with frequent ultrasounds for fetal assessment and fetal testing.    She will return in 2 weeks for another umbilical artery Doppler study.  We will reassess the fetal growth in 3 weeks.    We will start weekly NSTs at 28 weeks.  Should IUGR continue to be noted later in her pregnancy, delivery will be recommended at around 36 weeks, as long as the weekly fetal testing remains reassuring and her umbilical artery Doppler studies continue to show normal forward flow.  Due to the monozygotic twin pregnancy, she was referred to [facility] pediatric cardiology for a fetal echocardiogram.    She will return in 2 weeks for another umbilical artery Doppler study and amniotic fluid assessment.  The patient stated that all of her questions were answered today.  A total of 30 minutes was spent counseling and coordinating the care for this patient.  Greater than 50% of the time was spent in direct face-to-face contact.

## 2023-08-14 ENCOUNTER — Ambulatory Visit: Payer: Medicaid Other

## 2023-08-14 ENCOUNTER — Other Ambulatory Visit: Payer: Medicaid Other

## 2023-08-20 DIAGNOSIS — D573 Sickle-cell trait: Secondary | ICD-10-CM | POA: Diagnosis present

## 2023-08-22 ENCOUNTER — Other Ambulatory Visit: Payer: Medicaid Other

## 2023-08-22 ENCOUNTER — Ambulatory Visit: Payer: Medicaid Other

## 2023-09-03 ENCOUNTER — Other Ambulatory Visit: Payer: Self-pay | Admitting: Obstetrics and Gynecology

## 2023-09-03 DIAGNOSIS — O99019 Anemia complicating pregnancy, unspecified trimester: Secondary | ICD-10-CM | POA: Insufficient documentation

## 2023-09-03 DIAGNOSIS — Z98891 History of uterine scar from previous surgery: Secondary | ICD-10-CM

## 2023-09-03 DIAGNOSIS — O99013 Anemia complicating pregnancy, third trimester: Secondary | ICD-10-CM | POA: Diagnosis present

## 2023-09-03 DIAGNOSIS — D509 Iron deficiency anemia, unspecified: Secondary | ICD-10-CM | POA: Diagnosis present

## 2023-09-04 ENCOUNTER — Ambulatory Visit: Admitting: *Deleted

## 2023-09-04 ENCOUNTER — Ambulatory Visit: Attending: Obstetrics and Gynecology

## 2023-09-04 ENCOUNTER — Other Ambulatory Visit: Payer: Self-pay | Admitting: *Deleted

## 2023-09-04 VITALS — BP 134/63 | HR 84

## 2023-09-04 DIAGNOSIS — O365930 Maternal care for other known or suspected poor fetal growth, third trimester, not applicable or unspecified: Secondary | ICD-10-CM | POA: Diagnosis not present

## 2023-09-04 DIAGNOSIS — O30003 Twin pregnancy, unspecified number of placenta and unspecified number of amniotic sacs, third trimester: Secondary | ICD-10-CM | POA: Diagnosis not present

## 2023-09-04 DIAGNOSIS — Z3A30 30 weeks gestation of pregnancy: Secondary | ICD-10-CM | POA: Diagnosis not present

## 2023-09-04 DIAGNOSIS — O365990 Maternal care for other known or suspected poor fetal growth, unspecified trimester, not applicable or unspecified: Secondary | ICD-10-CM

## 2023-09-04 DIAGNOSIS — O365992 Maternal care for other known or suspected poor fetal growth, unspecified trimester, fetus 2: Secondary | ICD-10-CM | POA: Diagnosis present

## 2023-09-04 DIAGNOSIS — Z3A37 37 weeks gestation of pregnancy: Secondary | ICD-10-CM

## 2023-09-04 NOTE — Procedures (Signed)
 [name] 05/26/1996 [redacted]w[redacted]d  Fetus A Non-Stress Test Interpretation for 09/04/23  Indication: IUGR, twin pregnancy - NST only  Fetal Heart Rate A Mode: External Baseline Rate (A): 135 bpm Variability: Moderate Accelerations: 10 x 10 Decelerations: None Multiple birth?: Yes  Uterine Activity Mode: Toco, Palpation Contraction Frequency (min): Irregular Contraction Duration (sec): 40-60 Contraction Quality:  (unable to palpate) Resting Tone Palpated: Relaxed  Interpretation (Fetal Testing) Nonstress Test Interpretation: Reactive Comments: Reviewed with Dr. Oren Binet Rupnow 1995-09-08 [redacted]w[redacted]d   Fetus B Non-Stress Test Interpretation for 09/04/23  Indication: IUGR, twin pregnancy - NST only  Fetal Heart Rate Fetus B Mode: External Baseline Rate (B): 145 BPM Variability: Moderate Accelerations: 15 x 15 Decelerations: None  Uterine Activity Mode: Toco, Palpation Contraction Frequency (min): Irregular Contraction Duration (sec): 40-60 Contraction Quality:  (unable to palpate) Resting Tone Palpated: Relaxed  Interpretation (Baby B - Fetal Testing) Nonstress Test Interpretation (Baby B): Reactive Comments (Baby B): Reviewed with Dr. Judeth Cornfield

## 2023-09-09 ENCOUNTER — Ambulatory Visit: Admitting: *Deleted

## 2023-09-09 ENCOUNTER — Other Ambulatory Visit: Payer: Self-pay | Admitting: Obstetrics and Gynecology

## 2023-09-09 ENCOUNTER — Ambulatory Visit

## 2023-09-09 ENCOUNTER — Other Ambulatory Visit: Payer: Self-pay

## 2023-09-09 ENCOUNTER — Ambulatory Visit: Attending: Obstetrics and Gynecology

## 2023-09-09 VITALS — BP 136/79 | HR 93

## 2023-09-09 DIAGNOSIS — O365930 Maternal care for other known or suspected poor fetal growth, third trimester, not applicable or unspecified: Secondary | ICD-10-CM

## 2023-09-09 DIAGNOSIS — O30042 Twin pregnancy, dichorionic/diamniotic, second trimester: Secondary | ICD-10-CM | POA: Diagnosis present

## 2023-09-09 DIAGNOSIS — Z3A31 31 weeks gestation of pregnancy: Secondary | ICD-10-CM | POA: Insufficient documentation

## 2023-09-09 DIAGNOSIS — D509 Iron deficiency anemia, unspecified: Secondary | ICD-10-CM

## 2023-09-09 DIAGNOSIS — O365932 Maternal care for other known or suspected poor fetal growth, third trimester, fetus 2: Secondary | ICD-10-CM | POA: Diagnosis not present

## 2023-09-09 DIAGNOSIS — G40909 Epilepsy, unspecified, not intractable, without status epilepticus: Secondary | ICD-10-CM | POA: Diagnosis not present

## 2023-09-09 DIAGNOSIS — O99353 Diseases of the nervous system complicating pregnancy, third trimester: Secondary | ICD-10-CM | POA: Diagnosis not present

## 2023-09-09 DIAGNOSIS — O30043 Twin pregnancy, dichorionic/diamniotic, third trimester: Secondary | ICD-10-CM | POA: Diagnosis not present

## 2023-09-09 DIAGNOSIS — O365931 Maternal care for other known or suspected poor fetal growth, third trimester, fetus 1: Secondary | ICD-10-CM

## 2023-09-09 DIAGNOSIS — O365920 Maternal care for other known or suspected poor fetal growth, second trimester, not applicable or unspecified: Secondary | ICD-10-CM

## 2023-09-09 DIAGNOSIS — O26893 Other specified pregnancy related conditions, third trimester: Secondary | ICD-10-CM | POA: Insufficient documentation

## 2023-09-09 DIAGNOSIS — G40919 Epilepsy, unspecified, intractable, without status epilepticus: Secondary | ICD-10-CM

## 2023-09-09 DIAGNOSIS — O34219 Maternal care for unspecified type scar from previous cesarean delivery: Secondary | ICD-10-CM | POA: Insufficient documentation

## 2023-09-09 DIAGNOSIS — O365990 Maternal care for other known or suspected poor fetal growth, unspecified trimester, not applicable or unspecified: Secondary | ICD-10-CM

## 2023-09-09 DIAGNOSIS — O09293 Supervision of pregnancy with other poor reproductive or obstetric history, third trimester: Secondary | ICD-10-CM | POA: Insufficient documentation

## 2023-09-09 DIAGNOSIS — Z362 Encounter for other antenatal screening follow-up: Secondary | ICD-10-CM | POA: Diagnosis not present

## 2023-09-09 NOTE — Procedures (Signed)
 [name] Jul 04, 1995 [redacted]w[redacted]d  Fetus A Non-Stress Test Interpretation for 09/09/23  Indication: IUGR  Fetal Heart Rate A Baseline Rate (A): 135 bpm Variability: Moderate Accelerations: 10 x 10 Decelerations: None Multiple birth?: Yes  Uterine Activity Mode: Palpation, Toco Contraction Frequency (min): Occas w/UI Contraction Quality: Mild Resting Tone Palpated: Relaxed Resting Time: Adequate  Interpretation (Fetal Testing) Nonstress Test Interpretation: Reactive Overall Impression: Reassuring for gestational age Comments: Dr. Grace Bushy reviewed tracing.  [name] 07-04-1995 [redacted]w[redacted]d   Fetus B Non-Stress Test Interpretation for 09/09/23  Indication: IUGR  Fetal Heart Rate Fetus B Mode: External Baseline Rate (B): 140 BPM Variability: Moderate Accelerations: 10 x 10 Decelerations: None  Uterine Activity Mode: Palpation, Toco Contraction Frequency (min): Occas w/UI Contraction Quality: Mild Resting Tone Palpated: Relaxed Resting Time: Adequate  Interpretation (Baby B - Fetal Testing) Nonstress Test Interpretation (Baby B): Reactive Overall Impression (Baby B): Reassuring for gestational age Comments (Baby B): Dr. Grace Bushy reviewed tracing.

## 2023-09-12 ENCOUNTER — Ambulatory Visit

## 2023-09-12 ENCOUNTER — Ambulatory Visit: Attending: Maternal & Fetal Medicine | Admitting: *Deleted

## 2023-09-12 VITALS — BP 128/67 | HR 82

## 2023-09-12 DIAGNOSIS — D509 Iron deficiency anemia, unspecified: Secondary | ICD-10-CM

## 2023-09-12 DIAGNOSIS — O365931 Maternal care for other known or suspected poor fetal growth, third trimester, fetus 1: Secondary | ICD-10-CM | POA: Diagnosis present

## 2023-09-12 DIAGNOSIS — O365932 Maternal care for other known or suspected poor fetal growth, third trimester, fetus 2: Secondary | ICD-10-CM | POA: Diagnosis not present

## 2023-09-12 DIAGNOSIS — O30043 Twin pregnancy, dichorionic/diamniotic, third trimester: Secondary | ICD-10-CM | POA: Insufficient documentation

## 2023-09-12 DIAGNOSIS — Z3A31 31 weeks gestation of pregnancy: Secondary | ICD-10-CM | POA: Diagnosis not present

## 2023-09-12 NOTE — Procedures (Signed)
 [name] [birth year] [redacted]w[redacted]d   Fetus B Non-Stress Test Interpretation for 09/12/23  Indication: IUGR, DIDI  Fetal Heart Rate Fetus B Mode: External Baseline Rate (B): 135 BPM Variability: Moderate Accelerations: 10 x 10 Decelerations: None  Uterine Activity Mode: Toco Contraction Frequency (min): 1-4 Contraction Duration (sec): 60-90 Contraction Quality: Mild (pt states she did not feel any ctx nor pain) Resting Tone Palpated: Relaxed  Interpretation (Baby B - Fetal Testing) Nonstress Test Interpretation (Baby B): Reactive Comments (Baby B): Tracing reviewed byDr. Rosanne Gutting [name] August 21, 1995 [redacted]w[redacted]d  Fetus A Non-Stress Test Interpretation for 09/12/23  Indication: IUGR, DIDI  Fetal Heart Rate A Mode: External Baseline Rate (A): 135 bpm Variability: Moderate Accelerations: 10 x 10 Decelerations: None Multiple birth?: Yes  Uterine Activity Mode: Toco Contraction Frequency (min): 1-4 Contraction Duration (sec): 60-90 Contraction Quality: Mild (pt states she did not feel any ctx nor pain) Resting Tone Palpated: Relaxed  Interpretation (Fetal Testing) Nonstress Test Interpretation: Reactive Comments: Tracing reviewed byDr. Parke Poisson

## 2023-09-17 ENCOUNTER — Ambulatory Visit: Attending: Obstetrics | Admitting: Obstetrics

## 2023-09-17 ENCOUNTER — Other Ambulatory Visit: Payer: Self-pay | Admitting: Obstetrics and Gynecology

## 2023-09-17 ENCOUNTER — Ambulatory Visit: Attending: Obstetrics and Gynecology | Admitting: *Deleted

## 2023-09-17 ENCOUNTER — Ambulatory Visit

## 2023-09-17 VITALS — BP 143/76 | HR 81

## 2023-09-17 DIAGNOSIS — O34219 Maternal care for unspecified type scar from previous cesarean delivery: Secondary | ICD-10-CM | POA: Diagnosis not present

## 2023-09-17 DIAGNOSIS — O99353 Diseases of the nervous system complicating pregnancy, third trimester: Secondary | ICD-10-CM | POA: Diagnosis not present

## 2023-09-17 DIAGNOSIS — Z3A32 32 weeks gestation of pregnancy: Secondary | ICD-10-CM | POA: Diagnosis not present

## 2023-09-17 DIAGNOSIS — O365931 Maternal care for other known or suspected poor fetal growth, third trimester, fetus 1: Secondary | ICD-10-CM

## 2023-09-17 DIAGNOSIS — O30043 Twin pregnancy, dichorionic/diamniotic, third trimester: Secondary | ICD-10-CM | POA: Diagnosis not present

## 2023-09-17 DIAGNOSIS — O365932 Maternal care for other known or suspected poor fetal growth, third trimester, fetus 2: Secondary | ICD-10-CM | POA: Diagnosis not present

## 2023-09-17 DIAGNOSIS — O365990 Maternal care for other known or suspected poor fetal growth, unspecified trimester, not applicable or unspecified: Secondary | ICD-10-CM

## 2023-09-17 DIAGNOSIS — D509 Iron deficiency anemia, unspecified: Secondary | ICD-10-CM

## 2023-09-17 DIAGNOSIS — O09293 Supervision of pregnancy with other poor reproductive or obstetric history, third trimester: Secondary | ICD-10-CM

## 2023-09-17 DIAGNOSIS — G40909 Epilepsy, unspecified, not intractable, without status epilepticus: Secondary | ICD-10-CM | POA: Diagnosis not present

## 2023-09-17 DIAGNOSIS — O365992 Maternal care for other known or suspected poor fetal growth, unspecified trimester, fetus 2: Secondary | ICD-10-CM

## 2023-09-17 NOTE — Progress Notes (Signed)
 MFM Consult Note  [name] is currently at 32 weeks and 4 days.  [name] has been followed due to a dichorionic, diamniotic twin gestation with IUGR of twin B.    [name] denies any problems since her last exam.  [name] reports feeling fetal movements of both fetuses throughout the day.  Her blood pressures today were 143/76 and 141/92.  The patient reports that [name] was treated with nifedipine during her prior pregnancy.  [name] is not currently taking any antihypertensive medications.  A BPP performed today was 8 out of 8 for both twin A and twin B.  There was normal amniotic fluid noted around both twin A and twin B.  Doppler studies of the umbilical arteries performed today showed a normal S/D ratio for both twin A and twin B.  (S/D ratio: A: 2.94, B: 2.65).  There were no signs of absent or reversed end-diastolic flow noted today in either fetus.  The increased risk of preeclampsia due to the twin gestation and her history of hypertension was discussed.  The patient was advised to continue to monitor her blood pressures.    Preeclampsia precautions were reviewed.  Due to IUGR in a twin gestation, delivery will be recommended at around 36 weeks.    However, delivery prior to 36 weeks may be recommended should her blood pressures continue to increase or should [name] develop preeclampsia.  [name] will return in 1 week for another BPP and umbilical artery Doppler study.     The patient stated that all of her questions were answered today.  A total of 20 minutes was spent counseling and coordinating the care for this patient.  Greater than 50% of the time was spent in direct face-to-face contact.

## 2023-09-22 ENCOUNTER — Ambulatory Visit: Attending: Obstetrics

## 2023-09-22 ENCOUNTER — Other Ambulatory Visit: Payer: Self-pay | Admitting: Obstetrics and Gynecology

## 2023-09-22 ENCOUNTER — Ambulatory Visit: Attending: Obstetrics | Admitting: Obstetrics

## 2023-09-22 ENCOUNTER — Ambulatory Visit: Admitting: *Deleted

## 2023-09-22 ENCOUNTER — Other Ambulatory Visit

## 2023-09-22 VITALS — BP 158/93 | HR 88

## 2023-09-22 DIAGNOSIS — O99019 Anemia complicating pregnancy, unspecified trimester: Secondary | ICD-10-CM | POA: Diagnosis present

## 2023-09-22 DIAGNOSIS — O09293 Supervision of pregnancy with other poor reproductive or obstetric history, third trimester: Secondary | ICD-10-CM | POA: Diagnosis not present

## 2023-09-22 DIAGNOSIS — Z3A33 33 weeks gestation of pregnancy: Secondary | ICD-10-CM | POA: Insufficient documentation

## 2023-09-22 DIAGNOSIS — G40909 Epilepsy, unspecified, not intractable, without status epilepticus: Secondary | ICD-10-CM | POA: Insufficient documentation

## 2023-09-22 DIAGNOSIS — O365990 Maternal care for other known or suspected poor fetal growth, unspecified trimester, not applicable or unspecified: Secondary | ICD-10-CM | POA: Diagnosis present

## 2023-09-22 DIAGNOSIS — O365932 Maternal care for other known or suspected poor fetal growth, third trimester, fetus 2: Secondary | ICD-10-CM

## 2023-09-22 DIAGNOSIS — O365931 Maternal care for other known or suspected poor fetal growth, third trimester, fetus 1: Secondary | ICD-10-CM | POA: Insufficient documentation

## 2023-09-22 DIAGNOSIS — O99353 Diseases of the nervous system complicating pregnancy, third trimester: Secondary | ICD-10-CM | POA: Diagnosis not present

## 2023-09-22 DIAGNOSIS — O30043 Twin pregnancy, dichorionic/diamniotic, third trimester: Secondary | ICD-10-CM | POA: Insufficient documentation

## 2023-09-22 DIAGNOSIS — D509 Iron deficiency anemia, unspecified: Secondary | ICD-10-CM | POA: Diagnosis present

## 2023-09-22 DIAGNOSIS — O34219 Maternal care for unspecified type scar from previous cesarean delivery: Secondary | ICD-10-CM

## 2023-09-22 NOTE — Progress Notes (Signed)
 MFM Consult Note  [name] is currently at 33 weeks and 2 days.  She has been followed due to a dichorionic, diamniotic twin gestation with IUGR of twin B.    She denies any problems since her last exam.  She reports feeling fetal movements of both fetuses throughout the day.  Her blood pressures today were 158/93 and 159/88.  She is not currently taking any antihypertensive medications and denies any signs or symptoms of preeclampsia.  A BPP performed today was 8 out of 8 for both twin A and twin B.  There was normal amniotic fluid noted around both twin A and twin B.  Doppler studies of the umbilical arteries performed today showed a normal S/D ratio for both twin A and twin B.  (S/D ratio: A: 2.8, B: 2.48).  There were no signs of absent or reversed end-diastolic flow noted today in either fetus.  The increased risk of preeclampsia due to the twin gestation and her history of hypertension was discussed.  The patient was advised to continue to monitor her blood pressures.    Preeclampsia precautions were reviewed.    Due to IUGR in a twin gestation, delivery will be recommended at around 36 weeks.    However, delivery prior to 36 weeks may be recommended should her blood pressures continue to increase or should she develop preeclampsia.  She will return in 1 week for another BPP, growth scan, and umbilical artery Doppler study.     The patient stated that all of her questions were answered today.  A total of 20 minutes was spent counseling and coordinating the care for this patient.  Greater than 50% of the time was spent in direct face-to-face contact.

## 2023-09-25 ENCOUNTER — Inpatient Hospital Stay

## 2023-09-25 ENCOUNTER — Other Ambulatory Visit: Payer: Self-pay

## 2023-09-25 ENCOUNTER — Encounter: Payer: Self-pay | Admitting: Obstetrics and Gynecology

## 2023-09-25 ENCOUNTER — Inpatient Hospital Stay
Admission: AD | Admit: 2023-09-25 | Discharge: 2023-10-01 | DRG: 784 | Disposition: A | Discharge status: Home / Self Care | Source: Ambulatory Visit

## 2023-09-25 DIAGNOSIS — O365932 Maternal care for other known or suspected poor fetal growth, third trimester, fetus 2: Secondary | ICD-10-CM | POA: Diagnosis present

## 2023-09-25 DIAGNOSIS — O99013 Anemia complicating pregnancy, third trimester: Secondary | ICD-10-CM | POA: Diagnosis present

## 2023-09-25 DIAGNOSIS — O163 Unspecified maternal hypertension, third trimester: Secondary | ICD-10-CM

## 2023-09-25 DIAGNOSIS — O365930 Maternal care for other known or suspected poor fetal growth, third trimester, not applicable or unspecified: Secondary | ICD-10-CM | POA: Diagnosis present

## 2023-09-25 DIAGNOSIS — O9972 Diseases of the skin and subcutaneous tissue complicating childbirth: Secondary | ICD-10-CM | POA: Diagnosis present

## 2023-09-25 DIAGNOSIS — O9962 Diseases of the digestive system complicating childbirth: Secondary | ICD-10-CM | POA: Diagnosis present

## 2023-09-25 DIAGNOSIS — Z3A33 33 weeks gestation of pregnancy: Secondary | ICD-10-CM

## 2023-09-25 DIAGNOSIS — L910 Hypertrophic scar: Secondary | ICD-10-CM | POA: Diagnosis present

## 2023-09-25 DIAGNOSIS — G40909 Epilepsy, unspecified, not intractable, without status epilepticus: Secondary | ICD-10-CM

## 2023-09-25 DIAGNOSIS — O34211 Maternal care for low transverse scar from previous cesarean delivery: Secondary | ICD-10-CM | POA: Diagnosis present

## 2023-09-25 DIAGNOSIS — Z302 Encounter for sterilization: Secondary | ICD-10-CM

## 2023-09-25 DIAGNOSIS — O09293 Supervision of pregnancy with other poor reproductive or obstetric history, third trimester: Secondary | ICD-10-CM | POA: Diagnosis not present

## 2023-09-25 DIAGNOSIS — D62 Acute posthemorrhagic anemia: Secondary | ICD-10-CM | POA: Diagnosis not present

## 2023-09-25 DIAGNOSIS — D573 Sickle-cell trait: Secondary | ICD-10-CM | POA: Diagnosis present

## 2023-09-25 DIAGNOSIS — K219 Gastro-esophageal reflux disease without esophagitis: Secondary | ICD-10-CM | POA: Diagnosis present

## 2023-09-25 DIAGNOSIS — O99019 Anemia complicating pregnancy, unspecified trimester: Principal | ICD-10-CM

## 2023-09-25 DIAGNOSIS — O1494 Unspecified pre-eclampsia, complicating childbirth: Secondary | ICD-10-CM | POA: Diagnosis present

## 2023-09-25 DIAGNOSIS — O9081 Anemia of the puerperium: Secondary | ICD-10-CM | POA: Diagnosis not present

## 2023-09-25 DIAGNOSIS — Z2839 Other underimmunization status: Secondary | ICD-10-CM

## 2023-09-25 DIAGNOSIS — O09899 Supervision of other high risk pregnancies, unspecified trimester: Secondary | ICD-10-CM

## 2023-09-25 DIAGNOSIS — O34219 Maternal care for unspecified type scar from previous cesarean delivery: Secondary | ICD-10-CM | POA: Diagnosis not present

## 2023-09-25 DIAGNOSIS — O1414 Severe pre-eclampsia complicating childbirth: Principal | ICD-10-CM | POA: Diagnosis present

## 2023-09-25 DIAGNOSIS — Z91410 Personal history of adult physical and sexual abuse: Secondary | ICD-10-CM | POA: Diagnosis present

## 2023-09-25 DIAGNOSIS — O09299 Supervision of pregnancy with other poor reproductive or obstetric history, unspecified trimester: Secondary | ICD-10-CM

## 2023-09-25 DIAGNOSIS — D509 Iron deficiency anemia, unspecified: Principal | ICD-10-CM

## 2023-09-25 DIAGNOSIS — O1493 Unspecified pre-eclampsia, third trimester: Secondary | ICD-10-CM | POA: Diagnosis not present

## 2023-09-25 DIAGNOSIS — Z9889 Other specified postprocedural states: Secondary | ICD-10-CM

## 2023-09-25 DIAGNOSIS — O30043 Twin pregnancy, dichorionic/diamniotic, third trimester: Secondary | ICD-10-CM | POA: Diagnosis present

## 2023-09-25 DIAGNOSIS — Z98891 History of uterine scar from previous surgery: Secondary | ICD-10-CM

## 2023-09-25 LAB — PROTEIN / CREATININE RATIO, URINE
Creatinine, Urine: 77 mg/dL
Protein Creatinine Ratio: 0.79 mg/mg{creat} — ABNORMAL HIGH (ref 0.00–0.15)
Total Protein, Urine: 61 mg/dL

## 2023-09-25 LAB — CBC
HCT: 23.8 % — ABNORMAL LOW (ref 36.0–46.0)
Hemoglobin: 7.8 g/dL — ABNORMAL LOW (ref 12.0–15.0)
MCH: 24.2 pg — ABNORMAL LOW (ref 26.0–34.0)
MCHC: 32.8 g/dL (ref 30.0–36.0)
MCV: 73.9 fL — ABNORMAL LOW (ref 80.0–100.0)
Platelets: 196 10*3/uL (ref 150–400)
RBC: 3.22 MIL/uL — ABNORMAL LOW (ref 3.87–5.11)
RDW: 15.5 % (ref 11.5–15.5)
WBC: 8.9 10*3/uL (ref 4.0–10.5)
nRBC: 0.3 % — ABNORMAL HIGH (ref 0.0–0.2)

## 2023-09-25 LAB — COMPREHENSIVE METABOLIC PANEL WITH GFR
ALT: 11 U/L (ref 0–44)
AST: 19 U/L (ref 15–41)
Albumin: 3 g/dL — ABNORMAL LOW (ref 3.5–5.0)
Alkaline Phosphatase: 102 U/L (ref 38–126)
Anion gap: 11 (ref 5–15)
BUN: 7 mg/dL (ref 6–20)
CO2: 21 mmol/L — ABNORMAL LOW (ref 22–32)
Calcium: 8.8 mg/dL — ABNORMAL LOW (ref 8.9–10.3)
Chloride: 106 mmol/L (ref 98–111)
Creatinine, Ser: 0.46 mg/dL (ref 0.44–1.00)
GFR, Estimated: >60 mL/min (ref >60)
Glucose, Bld: 75 mg/dL (ref 70–99)
Potassium: 3.3 mmol/L — ABNORMAL LOW (ref 3.5–5.1)
Sodium: 138 mmol/L (ref 135–145)
Total Bilirubin: 0.7 mg/dL (ref 0.0–1.2)
Total Protein: 6.6 g/dL (ref 6.5–8.1)

## 2023-09-25 MED ORDER — IRON SUCROSE 300 MG IVPB - SIMPLE MED
300.0000 mg | Status: DC
  Administered 2023-09-25: 300 mg via INTRAVENOUS
  Filled 2023-09-25: qty 300

## 2023-09-25 MED ORDER — HYDRALAZINE HCL 20 MG/ML IJ SOLN: 10.0000 mg | INTRAMUSCULAR | Status: DC | PRN

## 2023-09-25 MED ORDER — PRENATAL MULTIVITAMIN CH
1.0000 | ORAL_TABLET | Freq: Every day | ORAL | Status: DC
  Administered 2023-09-27 – 2023-09-28 (×2): 1 via ORAL
  Filled 2023-09-25 (×2): qty 1

## 2023-09-25 MED ORDER — LABETALOL HCL 5 MG/ML IV SOLN
20.0000 mg | INTRAVENOUS | Status: DC | PRN
  Administered 2023-09-28 (×2): 20 mg via INTRAVENOUS
  Filled 2023-09-25 (×2): qty 4

## 2023-09-25 MED ORDER — BETAMETHASONE SOD PHOS & ACET 6 (3-3) MG/ML IJ SUSP
12.0000 mg | INTRAMUSCULAR | Status: AC
  Administered 2023-09-25 – 2023-09-26 (×2): 12 mg via INTRAMUSCULAR
  Filled 2023-09-25: qty 5

## 2023-09-25 MED ORDER — DIPHENHYDRAMINE HCL 25 MG PO CAPS
50.0000 mg | ORAL_CAPSULE | Freq: Every evening | ORAL | Status: DC | PRN
  Administered 2023-09-25 – 2023-09-27 (×3): 50 mg via ORAL
  Filled 2023-09-25 (×4): qty 2

## 2023-09-25 MED ORDER — ACETAMINOPHEN 500 MG PO TABS
1000.0000 mg | ORAL_TABLET | Freq: Four times a day (QID) | ORAL | Status: DC | PRN
  Administered 2023-09-26 – 2023-09-28 (×2): 1000 mg via ORAL
  Filled 2023-09-25 (×2): qty 2

## 2023-09-25 MED ORDER — FERROUS SULFATE 325 (65 FE) MG PO TABS
325.0000 mg | ORAL_TABLET | Freq: Two times a day (BID) | ORAL | Status: DC
  Administered 2023-09-26 – 2023-09-29 (×5): 325 mg via ORAL
  Filled 2023-09-25 (×5): qty 1

## 2023-09-25 MED ORDER — CALCIUM CARBONATE ANTACID 500 MG PO CHEW
2.0000 | CHEWABLE_TABLET | ORAL | Status: DC | PRN
  Administered 2023-09-26 – 2023-09-28 (×7): 400 mg via ORAL
  Filled 2023-09-25 (×8): qty 2

## 2023-09-25 MED ORDER — LABETALOL HCL 5 MG/ML IV SOLN: 80.0000 mg | INTRAVENOUS | Status: DC | PRN

## 2023-09-25 MED ORDER — LABETALOL HCL 5 MG/ML IV SOLN
40.0000 mg | INTRAVENOUS | Status: DC | PRN
  Filled 2023-09-25: qty 8

## 2023-09-25 MED ORDER — LACTATED RINGERS IV SOLN
125.0000 mL/h | INTRAVENOUS | Status: AC
  Administered 2023-09-25: 125 mL/h via INTRAVENOUS

## 2023-09-25 MED ORDER — POTASSIUM CHLORIDE CRYS ER 10 MEQ PO TBCR
20.0000 meq | EXTENDED_RELEASE_TABLET | Freq: Once | ORAL | Status: AC
  Administered 2023-09-25: 20 meq via ORAL
  Filled 2023-09-25: qty 2

## 2023-09-25 NOTE — H&P (Signed)
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 contact. ----------------------------------------------------------------------                   Ma Rings, MD Electronically Signed Final Report   09/22/2023 01:49 pm ----------------------------------------------------------------------   [place] MFM UA CORD DOPPLER Result Date: 09/22/2023 ----------------------------------------------------------------------  OBSTETRICS REPORT                       (Signed Final 09/22/2023 01:49 pm) ---------------------------------------------------------------------- Patient Info  ID #:       [id number]                          D.O.B.:  [birth year] (28 yrs)(F)  Name:       [name]             Visit Date: 09/22/2023 10:52 am  ---------------------------------------------------------------------- Performed By  Attending:        Ma Rings MD         Ref. Address:     [facility]                                                             [address]                                                             [place], [place],                                                             [place] [address]  Performed By:     Marcellina Millin       [facility]  Referred By:      Haroldine Laws                    CNM ---------------------------------------------------------------------- Orders  #  Description                           Code        Ordered By  1  [place] MFM FETAL BPP WO NON               76819.01    RAVI SHANKAR     STRESS  2  [place] MFM FETAL BPP WO NST               76819.1     RAVI SHANKAR     ADDL GESTATION  3  [place] MFM UA CORD DOPPLER                76820.02    RAVI SHANKAR  4  [place] MFM UA ADDL GEST                   76820.01    RAVI SHANKAR ----------------------------------------------------------------------  #  Order #                     Accession #                Episode #  1  [id number]                   [id number]                 [id number]  2  [id number]                   [id number]                 [id number]  3  [id number]                   [id number]                 [id number]  4  [id number]                   [id number]                 [id number] ---------------------------------------------------------------------- Indications  Maternal care for known or suspected poor      O36.5931  fetal growth, third trimester, fetus 1 IUGR  Maternal care for known or suspected poor      O36.5932  fetal growth, third trimester, fetus 2 IUGR  Twin pregnancy, di/di, third trimester         O30.043  Poor  obstetrical history (Pre-eclampsia in     O09.299  postpartum)  History of cesarean delivery, currently        O34.219  pregnant  Medical complication of pregnancy (Epilepsy)   O26.90  [redacted] weeks gestation of pregnancy                Z3A.33 ---------------------------------------------------------------------- Fetal Evaluation (Fetus A)  Num  Of Fetuses:         2  Fetal Heart Rate(bpm):  149  Cardiac Activity:       Observed  Fetal Lie:              Maternal right side  Presentation:           Cephalic  Placenta:               Posterior  P. Cord Insertion:      Previously seen  Membrane Desc:      Dividing Membrane seen - Dichorionic.  Amniotic Fluid  AFI FV:      Within normal limits                              Largest Pocket(cm)                              4.62 ---------------------------------------------------------------------- Biophysical Evaluation (Fetus A)  Amniotic F.V:   Pocket => 2 cm             F. Tone:        Observed  F. Movement:    Observed                   Score:          8/8  F. Breathing:   Observed ---------------------------------------------------------------------- OB History  Gravidity:    3         Term:   1         SAB:   1  Living:       1 ---------------------------------------------------------------------- Gestational Age (Fetus A)  LMP:           29w 6d        Date:  02/25/23                   EDD:   12/02/23  Best:          33w 2d     Det. By:  Marcella Dubs         EDD:   11/08/23                                      (03/30/23) ---------------------------------------------------------------------- Doppler - Fetal Vessels (Fetus A)  Umbilical Artery   S/D     %tile      RI    %tile      PI    %tile     PSV    ADFV    RDFV                                                     (cm/s)    2.8       63    0.64       68    0.97       68    58.76      No      No ---------------------------------------------------------------------- Fetal Evaluation (Fetus B)  Num Of Fetuses:         2  Fetal  Heart Rate(bpm):  131  Cardiac Activity:  Observed  Fetal Lie:              Maternal left side  Presentation:           Cephalic  Placenta:               Posterior  P. Cord Insertion:      Previously seen  Membrane Desc:      Dividing Membrane seen - Dichorionic.  Amniotic Fluid  AFI FV:      Within normal limits                              Largest Pocket(cm)                              4.26 ---------------------------------------------------------------------- Biophysical Evaluation (Fetus B)  Amniotic F.V:   Pocket => 2 cm             F. Tone:        Observed  F. Movement:    Observed                   Score:          8/8  F. Breathing:   Observed ---------------------------------------------------------------------- Gestational Age (Fetus B)  LMP:           29w 6d        Date:  02/25/23                   EDD:   12/02/23  Best:          33w 2d     Det. By:  Marcella Dubs         EDD:   11/08/23                                      (03/30/23) ---------------------------------------------------------------------- Doppler - Fetal Vessels (Fetus B)  Umbilical Artery   S/D     %tile      RI    %tile      PI    %tile     PSV    ADFV    RDFV                                                     (cm/s)   2.48       43     0.6       51    0.92       59    37.18      No      No ---------------------------------------------------------------------- Comments  [name] is currently at 33 weeks and 2 days.  She  has been followed due to a dichorionic, diamniotic twin  gestation with IUGR of twin B.  She denies any problems since her last exam.  She reports  feeling fetal movements of both fetuses throughout the day.  Her blood pressures today were 158/93 and 159/88.  She is  not currently taking any antihypertensive medications and  denies any signs or symptoms of preeclampsia.  A BPP performed today was 8 out of 8 for both twin A and  twin B.  There was normal amniotic fluid noted around both twin A  and twin B.   Doppler studies of the umbilical arteries performed today  showed a normal S/D ratio for both twin A and twin B.  (S/D  ratio: A: 2.8, B: 2.48).  There were no signs of absent or  reversed end-diastolic flow noted today in either fetus.  The increased risk of preeclampsia due to the twin gestation  and her history of hypertension was discussed.  The patient  was advised to continue to monitor her blood pressures.  Preeclampsia precautions were reviewed.  Due to IUGR in a twin gestation, delivery will be  recommended at around 36 weeks.  However, delivery prior to 36 weeks may be recommended  should her blood pressures continue to increase or should  she develop preeclampsia.  She will return in 1 week for another BPP, growth scan, and  umbilical artery Doppler study.  The patient stated that all of her questions were answered  today.  A total of 20 minutes was spent counseling and coordinating  the care for this patient.  Greater than 50% of the time was  spent in direct face-to-face contact. ----------------------------------------------------------------------                   Ma Rings, MD Electronically Signed Final Report   09/22/2023 01:49 pm ----------------------------------------------------------------------   [place] MFM UA ADDL GEST Result Date: 09/22/2023 ----------------------------------------------------------------------  OBSTETRICS REPORT                       (Signed Final 09/22/2023 01:49 pm) ---------------------------------------------------------------------- Patient Info  ID #:       [id number]                          D.O.B.:  May 14, 1996 (28 yrs)(F)  Name:       [name]             Visit Date: 09/22/2023 10:52 am ---------------------------------------------------------------------- Performed By  Attending:        Ma Rings MD         Ref. Address:     [facility]                                                             [address]                                                              [place], [place],                                                             [place] [address]  Performed By:     Marcellina Millin       Location:         [facility]                                     [facility] at                                                             [facility]  Referred By:      Haroldine Laws                    CNM ---------------------------------------------------------------------- Orders  #  Description                           Code        Ordered By  1  [place] MFM FETAL BPP WO NON               76819.01    RAVI SHANKAR     STRESS  2  [place] MFM FETAL BPP WO NST               76819.1     RAVI SHANKAR     ADDL GESTATION  3  [place] MFM UA CORD DOPPLER                76820.02    RAVI SHANKAR  4  [place] MFM UA ADDL GEST                   76820.01    RAVI SHANKAR ----------------------------------------------------------------------  #  Order #                     Accession #                Episode #  1  [id number]                   [id number]                 [id number]  2  [id number]                   [id number]                 [id number]  3  [id number]                   [id number]                 [id number]  4  [id number]                   [id number]                 [id number] ---------------------------------------------------------------------- Indications  Maternal care for known or suspected poor      O36.5931  fetal growth, third trimester, fetus 1 IUGR  Maternal care for known  or suspected poor      O36.5932  fetal growth, third trimester, fetus 2 IUGR  Twin pregnancy, di/di, third trimester         O30.043  Poor obstetrical history (Pre-eclampsia in     O09.299  postpartum)  History of cesarean delivery, currently        O34.219  pregnant  Medical complication of pregnancy (Epilepsy)   O26.90  [redacted] weeks gestation of pregnancy                Z3A.33  ---------------------------------------------------------------------- Fetal Evaluation (Fetus A)  Num Of Fetuses:         2  Fetal Heart Rate(bpm):  149  Cardiac Activity:       Observed  Fetal Lie:              Maternal right side  Presentation:           Cephalic  Placenta:               Posterior  P. Cord Insertion:      Previously seen  Membrane Desc:      Dividing Membrane seen - Dichorionic.  Amniotic Fluid  AFI FV:      Within normal limits                              Largest Pocket(cm)                              4.62 ---------------------------------------------------------------------- Biophysical Evaluation (Fetus A)  Amniotic F.V:   Pocket => 2 cm             F. Tone:        Observed  F. Movement:    Observed                   Score:          8/8  F. Breathing:   Observed ---------------------------------------------------------------------- OB History  Gravidity:    3         Term:   1         SAB:   1  Living:       1 ---------------------------------------------------------------------- Gestational Age (Fetus A)  LMP:           29w 6d        Date:  02/25/23                   EDD:   12/02/23  Best:          33w 2d     Det. By:  Marcella Dubs         EDD:   11/08/23                                      (03/30/23) ---------------------------------------------------------------------- Doppler - Fetal Vessels (Fetus A)  Umbilical Artery   S/D     %tile      RI    %tile      PI    %tile     PSV    ADFV    RDFV                                                     (  cm/s)    2.8       63    0.64       68    0.97       68    58.76      No      No ---------------------------------------------------------------------- Fetal Evaluation (Fetus B)  Num Of Fetuses:         2  Fetal Heart Rate(bpm):  131  Cardiac Activity:       Observed  Fetal Lie:              Maternal left side  Presentation:           Cephalic  Placenta:               Posterior  P. Cord Insertion:      Previously seen  Membrane Desc:       Dividing Membrane seen - Dichorionic.  Amniotic Fluid  AFI FV:      Within normal limits                              Largest Pocket(cm)                              4.26 ---------------------------------------------------------------------- Biophysical Evaluation (Fetus B)  Amniotic F.V:   Pocket => 2 cm             F. Tone:        Observed  F. Movement:    Observed                   Score:          8/8  F. Breathing:   Observed ---------------------------------------------------------------------- Gestational Age (Fetus B)  LMP:           29w 6d        Date:  02/25/23                   EDD:   12/02/23  Best:          33w 2d     Det. By:  Marcella Dubs         EDD:   11/08/23                                      (03/30/23) ---------------------------------------------------------------------- Doppler - Fetal Vessels (Fetus B)  Umbilical Artery   S/D     %tile      RI    %tile      PI    %tile     PSV    ADFV    RDFV                                                     (cm/s)   2.48       43     0.6       51    0.92       59    37.18      No      No ---------------------------------------------------------------------- Comments  [name] is currently at 33 weeks and 2 days.  She  has been followed due to a dichorionic, diamniotic twin  gestation with IUGR of twin B.  She denies any problems since her last exam.  She reports  feeling fetal movements of both fetuses throughout the day.  Her blood pressures today were 158/93 and 159/88.  She is  not currently taking any antihypertensive medications and  denies any signs or symptoms of preeclampsia.  A BPP performed today was 8 out of 8 for both twin A and  twin B.  There was normal amniotic fluid noted around both twin A  and twin B.  Doppler studies of the umbilical arteries performed today  showed a normal S/D ratio for both twin A and twin B.  (S/D  ratio: A: 2.8, B: 2.48).  There were no signs of absent or  reversed end-diastolic flow noted today in either  fetus.  The increased risk of preeclampsia due to the twin gestation  and her history of hypertension was discussed.  The patient  was advised to continue to monitor her blood pressures.  Preeclampsia precautions were reviewed.  Due to IUGR in a twin gestation, delivery will be  recommended at around 36 weeks.  However, delivery prior to 36 weeks may be recommended  should her blood pressures continue to increase or should  she develop preeclampsia.  She will return in 1 week for another BPP, growth scan, and  umbilical artery Doppler study.  The patient stated that all of her questions were answered  today.  A total of 20 minutes was spent counseling and coordinating  the care for this patient.  Greater than 50% of the time was  spent in direct face-to-face contact. ----------------------------------------------------------------------                   Ma Rings, MD Electronically Signed Final Report   09/22/2023 01:49 pm ----------------------------------------------------------------------   [place] MFM FETAL BPP WO NON STRESS Result Date: 09/17/2023 ----------------------------------------------------------------------  OBSTETRICS REPORT                       (Signed Final 09/17/2023 09:12 am) ---------------------------------------------------------------------- Patient Info  ID #:       [id number]                          D.O.B.:  [birth year] (28 yrs)(F)  Name:       [name]             Visit Date: 09/17/2023 07:32 am ---------------------------------------------------------------------- Performed By  Attending:        Ma Rings MD         Ref. Address:     [facility]                                                             [address] [place],  [place] [address]  Performed By:     Marcellina Millin       Location[facility]                                                             [facility]  Referred By:      Haroldine Laws                    CNM ---------------------------------------------------------------------- Orders  #  Description                           Code        Ordered By  1  [place] MFM FETAL BPP WO NON               76819.01    RAVI SHANKAR     STRESS  2  [place] MFM FETAL BPP WO NST               76819.1     RAVI SHANKAR     ADDL GESTATION  3  [place] MFM UA CORD DOPPLER                76820.02    RAVI SHANKAR  4  [place] MFM UA ADDL GEST                   76820.01    RAVI SHANKAR ----------------------------------------------------------------------  #  Order #                     Accession #                Episode #  1  [id number]                   [id number]                 [id number]  2  [id number]                   [id number]                 [id number]  3  [id number]                   [id number]                 [id number]  4  [id number]  [id number]                 [id number] ---------------------------------------------------------------------- Indications  Maternal care for known or suspected poor      O36.5931  fetal growth, third trimester, fetus 1 IUGR  Maternal care for known or suspected poor      O36.5932  fetal growth, third trimester, fetus 2 IUGR  Twin pregnancy, di/di, third trimester         O30.043  Poor obstetrical history (Pre-eclampsia in     O09.299  postpartum)  History of cesarean delivery, currently        O34.219  pregnant  Medical complication of pregnancy (Epilepsy)   O26.90  [redacted] weeks gestation of pregnancy                Z3A.32 ---------------------------------------------------------------------- Vital Signs  BP:          141/92 ---------------------------------------------------------------------- Fetal Evaluation (Fetus A)  Num Of Fetuses:         2  Fetal Heart Rate(bpm):  130   Cardiac Activity:       Observed  Fetal Lie:              Maternal right side  Presentation:           Cephalic  Placenta:               Posterior  P. Cord Insertion:      Previously seen  Membrane Desc:      Dividing Membrane seen - Dichorionic.  Amniotic Fluid  AFI FV:      Within normal limits                              Largest Pocket(cm)                              7.06 ---------------------------------------------------------------------- Biophysical Evaluation (Fetus A)  Amniotic F.V:   Pocket => 2 cm             F. Tone:        Observed  F. Movement:    Observed                   Score:          8/8  F. Breathing:   Observed ---------------------------------------------------------------------- OB History  Gravidity:    3         Term:   1         SAB:   1  Living:       1 ---------------------------------------------------------------------- Gestational Age (Fetus A)  LMP:           29w 1d        Date:  02/25/23                   EDD:   12/02/23  Best:          Armida Sans 4d     Det. ByMarcella Dubs         EDD:   11/08/23                                      (03/30/23) ---------------------------------------------------------------------- Doppler - Fetal Vessels (Fetus A)  Umbilical Artery   S/D     %tile  RI    %tile      PI    %tile     PSV    ADFV    RDFV                                                     (cm/s)   2.94       68    0.66       73    1.03       76    67.47      No      No ---------------------------------------------------------------------- Fetal Evaluation (Fetus B)  Num Of Fetuses:         2  Fetal Heart Rate(bpm):  145  Cardiac Activity:       Observed  Fetal Lie:              Maternal left side  Presentation:           Variable  Placenta:               Posterior  P. Cord Insertion:      Previously seen  Membrane Desc:      Dividing Membrane seen - Dichorionic.  Amniotic Fluid  AFI FV:      Within normal limits                              Largest Pocket(cm)                               4 ---------------------------------------------------------------------- Biophysical Evaluation (Fetus B)  Amniotic F.V:   Pocket => 2 cm             F. Tone:        Observed  F. Movement:    Observed                   Score:          8/8  F. Breathing:   Observed ---------------------------------------------------------------------- Gestational Age (Fetus B)  LMP:           29w 1d        Date:  02/25/23                   EDD:   12/02/23  Best:          Armida Sans 4d     Det. By:  Marcella Dubs         EDD:   11/08/23                                      (03/30/23) ---------------------------------------------------------------------- Doppler - Fetal Vessels (Fetus B)  Umbilical Artery   S/D     %tile      RI    %tile      PI    %tile     PSV    ADFV    RDFV                                                     (  cm/s)   2.65       51    0.62       57    0.93       58     49.3      No      No ---------------------------------------------------------------------- Comments  [name] is currently at 32 weeks and 4 days.  She  has been followed due to a dichorionic, diamniotic twin  gestation with IUGR of twin B.  She denies any problems since her last exam.  She reports  feeling fetal movements of both fetuses throughout the day.  Her blood pressures today were 143/76 and 141/92.  The  patient reports that she was treated with nifedipine during her  prior pregnancy.  She is not currently taking any  antihypertensive medications.  A BPP performed today was 8 out of 8 for both twin A and  twin B.  There was normal amniotic fluid noted around both twin A  and twin B.  Doppler studies of the umbilical arteries performed today  showed a normal S/D ratio for both twin A and twin B.  (S/D  ratio: A: 2.94, B: 2.65).  There were no signs of absent or  reversed end-diastolic flow noted today in either fetus.  The increased risk of preeclampsia due to the twin gestation  and her history of hypertension was discussed.  The  patient  was advised to continue to monitor her blood pressures.  Preeclampsia precautions were reviewed.  Due to IUGR in a twin gestation, delivery will be  recommended at around 36 weeks.  However, delivery prior to 36 weeks may be recommended  should her blood pressures continue to increase or should  she develop preeclampsia.  She will return in 1 week for another BPP and umbilical artery  Doppler study.  The patient stated that all of her questions were answered  today.  A total of 20 minutes was spent counseling and coordinating  the care for this patient.  Greater than 50% of the time was  spent in direct face-to-face contact. ----------------------------------------------------------------------                   Ma Rings, MD Electronically Signed Final Report   09/17/2023 09:12 am ----------------------------------------------------------------------   [place] MFM FETAL BPP WO NST ADDL GESTATION Result Date: 09/17/2023 ----------------------------------------------------------------------  OBSTETRICS REPORT                       (Signed Final 09/17/2023 09:12 am) ---------------------------------------------------------------------- Patient Info  ID #:       [id number]                          D.O.B.:  Jan 17, 1996 (28 yrs)(F)  Name:       [name]             Visit Date: 09/17/2023 07:32 am ---------------------------------------------------------------------- Performed By  Attending:        Ma Rings MD         Ref. Address:     [facility]                                                             [address], [place],                                                             [place] [address]  Performed By:     Marcellina Millin       Location[facility]                                     [facility]  Referred By:      Haroldine Laws                    CNM ---------------------------------------------------------------------- Orders  #  Description                           Code        Ordered By  1  [place] MFM FETAL BPP WO NON               76819.01    RAVI SHANKAR     STRESS  2  [place] MFM FETAL BPP WO NST               76819.1     RAVI [facility]     ADDL GESTATION  3  [place] MFM UA CORD DOPPLER                76820.02    RAVI SHANKAR  4  [place] MFM UA ADDL GEST                   76820.01    RAVI SHANKAR ----------------------------------------------------------------------  #  Order #                     Accession #                Episode #  1  [id number]                   [id number]                 [id number]  2  [id number]                   [id number]  [id number]  3  [id number]                   [id number]                 [id number]  4  [id number]                   [id number]                 [id number] ---------------------------------------------------------------------- Indications  Maternal care for known or suspected poor      O36.5931  fetal growth, third trimester, fetus 1 IUGR  Maternal care for known or suspected poor      O36.5932  fetal growth, third trimester, fetus 2 IUGR  Twin pregnancy, di/di, third trimester         O30.043  Poor obstetrical history (Pre-eclampsia in     O09.299  postpartum)  History of cesarean delivery, currently        O34.219  pregnant  Medical complication of pregnancy (Epilepsy)   O26.90  [redacted] weeks gestation of pregnancy                Z3A.32 ---------------------------------------------------------------------- Vital Signs  BP:          141/92 ---------------------------------------------------------------------- Fetal Evaluation (Fetus A)  Num Of Fetuses:         2  Fetal Heart Rate(bpm):  130  Cardiac Activity:       Observed  Fetal Lie:              Maternal right side  Presentation:           Cephalic  Placenta:                Posterior  P. Cord Insertion:      Previously seen  Membrane Desc:      Dividing Membrane seen - Dichorionic.  Amniotic Fluid  AFI FV:      Within normal limits                              Largest Pocket(cm)                              7.06 ---------------------------------------------------------------------- Biophysical Evaluation (Fetus A)  Amniotic F.V:   Pocket => 2 cm             F. Tone:        Observed  F. Movement:    Observed                   Score:          8/8  F. Breathing:   Observed ---------------------------------------------------------------------- OB History  Gravidity:    3         Term:   1         SAB:   1  Living:       1 ---------------------------------------------------------------------- Gestational Age (Fetus A)  LMP:           29w 1d        Date:  02/25/23                   EDD:   12/02/23  Best:          Armida Sans 4d     Det. By:  Marcella Dubs  EDD:   11/08/23                                      (03/30/23) ---------------------------------------------------------------------- Doppler - Fetal Vessels (Fetus A)  Umbilical Artery   S/D     %tile      RI    %tile      PI    %tile     PSV    ADFV    RDFV                                                     (cm/s)   2.94       68    0.66       73    1.03       76    67.47      No      No ---------------------------------------------------------------------- Fetal Evaluation (Fetus B)  Num Of Fetuses:         2  Fetal Heart Rate(bpm):  145  Cardiac Activity:       Observed  Fetal Lie:              Maternal left side  Presentation:           Variable  Placenta:               Posterior  P. Cord Insertion:      Previously seen  Membrane Desc:      Dividing Membrane seen - Dichorionic.  Amniotic Fluid  AFI FV:      Within normal limits                              Largest Pocket(cm)                              4 ---------------------------------------------------------------------- Biophysical Evaluation (Fetus B)  Amniotic F.V:   Pocket  => 2 cm             F. Tone:        Observed  F. Movement:    Observed                   Score:          8/8  F. Breathing:   Observed ---------------------------------------------------------------------- Gestational Age (Fetus B)  LMP:           29w 1d        Date:  02/25/23                   EDD:   12/02/23  Best:          Armida Sans 4d     Det. ByMarcella Dubs         EDD:   11/08/23                                      (03/30/23) ---------------------------------------------------------------------- Doppler - Fetal Vessels (Fetus B)  Umbilical Artery   S/D     %tile  RI    %tile      PI    %tile     PSV    ADFV    RDFV                                                     (cm/s)   2.65       51    0.62       57    0.93       58     49.3      No      No ---------------------------------------------------------------------- Comments  [name] is currently at 32 weeks and 4 days.  She  has been followed due to a dichorionic, diamniotic twin  gestation with IUGR of twin B.  She denies any problems since her last exam.  She reports  feeling fetal movements of both fetuses throughout the day.  Her blood pressures today were 143/76 and 141/92.  The  patient reports that she was treated with nifedipine during her  prior pregnancy.  She is not currently taking any  antihypertensive medications.  A BPP performed today was 8 out of 8 for both twin A and  twin B.  There was normal amniotic fluid noted around both twin A  and twin B.  Doppler studies of the umbilical arteries performed today  showed a normal S/D ratio for both twin A and twin B.  (S/D  ratio: A: 2.94, B: 2.65).  There were no signs of absent or  reversed end-diastolic flow noted today in either fetus.  The increased risk of preeclampsia due to the twin gestation  and her history of hypertension was discussed.  The patient  was advised to continue to monitor her blood pressures.  Preeclampsia precautions were reviewed.  Due to IUGR in a twin  gestation, delivery will be  recommended at around 36 weeks.  However, delivery prior to 36 weeks may be recommended  should her blood pressures continue to increase or should  she develop preeclampsia.  She will return in 1 week for another BPP and umbilical artery  Doppler study.  The patient stated that all of her questions were answered  today.  A total of 20 minutes was spent counseling and coordinating  the care for this patient.  Greater than 50% of the time was  spent in direct face-to-face contact. ----------------------------------------------------------------------                   Ma Rings, MD Electronically Signed Final Report   09/17/2023 09:12 am ----------------------------------------------------------------------   [place] MFM UA CORD DOPPLER Result Date: 09/17/2023 ----------------------------------------------------------------------  OBSTETRICS REPORT                       (Signed Final 09/17/2023 09:12 am) ---------------------------------------------------------------------- Patient Info  ID #:       [id number]                          D.O.B.:  [birth year] (28 yrs)(F)  Name:       [name]             Visit Date: 09/17/2023 07:32 am ---------------------------------------------------------------------- Performed By  Attending:        Ma Rings MD  Ref. Address:     [facility]                                                             [address]                                                             [place], [place],                                                             [place] [address]  Performed By:     Marcellina Millin       Location:         [facility]                    [facility]                                     [facility]  Referred By:      Haroldine Laws                    CNM  ---------------------------------------------------------------------- Orders  #  Description                           Code        Ordered By  1  [place] MFM FETAL BPP WO NON               76819.01    RAVI SHANKAR     STRESS  2  [place] MFM FETAL BPP WO NST               40102.7     RAVI SHANKAR     ADDL GESTATION  3  [place] MFM UA CORD DOPPLER                76820.02    RAVI SHANKAR  4  [place] MFM UA ADDL GEST  76820.01    RAVI SHANKAR ----------------------------------------------------------------------  #  Order #                     Accession #                Episode #  1  [id number]                   [id number]                 [id number]  2  [id number]                   [id number]                 [id number]  3  [id number]                   [id number]                 [id number]  4  [id number]                   [id number]                 [id number] ---------------------------------------------------------------------- Indications  Maternal care for known or suspected poor      O36.5931  fetal growth, third trimester, fetus 1 IUGR  Maternal care for known or suspected poor      O36.5932  fetal growth, third trimester, fetus 2 IUGR  Twin pregnancy, di/di, third trimester         O30.043  Poor obstetrical history (Pre-eclampsia in     O09.299  postpartum)  History of cesarean delivery, currently        O34.219  pregnant  Medical complication of pregnancy (Epilepsy)   O26.90  [redacted] weeks gestation of pregnancy                Z3A.32 ---------------------------------------------------------------------- Vital Signs  BP:          141/92 ---------------------------------------------------------------------- Fetal Evaluation (Fetus A)  Num Of Fetuses:         2  Fetal Heart Rate(bpm):  130  Cardiac Activity:       Observed  Fetal Lie:              Maternal right side  Presentation:           Cephalic  Placenta:               Posterior  P. Cord Insertion:      Previously seen  Membrane Desc:      Dividing Membrane seen -  Dichorionic.  Amniotic Fluid  AFI FV:      Within normal limits                              Largest Pocket(cm)                              7.06 ---------------------------------------------------------------------- Biophysical Evaluation (Fetus A)  Amniotic F.V:   Pocket => 2 cm             F. Tone:        Observed  F. Movement:    Observed                   Score:  8/8  F. Breathing:   Observed ---------------------------------------------------------------------- OB History  Gravidity:    3         Term:   1         SAB:   1  Living:       1 ---------------------------------------------------------------------- Gestational Age (Fetus A)  LMP:           29w 1d        Date:  02/25/23                   EDD:   12/02/23  Best:          Armida Sans 4d     Det. ByMarcella Dubs         EDD:   11/08/23                                      (03/30/23) ---------------------------------------------------------------------- Doppler - Fetal Vessels (Fetus A)  Umbilical Artery   S/D     %tile      RI    %tile      PI    %tile     PSV    ADFV    RDFV                                                     (cm/s)   2.94       68    0.66       73    1.03       76    67.47      No      No ---------------------------------------------------------------------- Fetal Evaluation (Fetus B)  Num Of Fetuses:         2  Fetal Heart Rate(bpm):  145  Cardiac Activity:       Observed  Fetal Lie:              Maternal left side  Presentation:           Variable  Placenta:               Posterior  P. Cord Insertion:      Previously seen  Membrane Desc:      Dividing Membrane seen - Dichorionic.  Amniotic Fluid  AFI FV:      Within normal limits                              Largest Pocket(cm)                              4 ---------------------------------------------------------------------- Biophysical Evaluation (Fetus B)  Amniotic F.V:   Pocket => 2 cm             F. Tone:        Observed  F. Movement:    Observed                   Score:           8/8  F. Breathing:   Observed ---------------------------------------------------------------------- Gestational Age (Fetus B)  LMP:  29w 1d        Date:  02/25/23                   EDD:   12/02/23  Best:          Armida Sans 4d     Det. By:  Marcella Dubs         EDD:   11/08/23                                      (03/30/23) ---------------------------------------------------------------------- Doppler - Fetal Vessels (Fetus B)  Umbilical Artery   S/D     %tile      RI    %tile      PI    %tile     PSV    ADFV    RDFV                                                     (cm/s)   2.65       51    0.62       57    0.93       58     49.3      No      No ---------------------------------------------------------------------- Comments  [name] is currently at 32 weeks and 4 days.  She  has been followed due to a dichorionic, diamniotic twin  gestation with IUGR of twin B.  She denies any problems since her last exam.  She reports  feeling fetal movements of both fetuses throughout the day.  Her blood pressures today were 143/76 and 141/92.  The  patient reports that she was treated with nifedipine during her  prior pregnancy.  She is not currently taking any  antihypertensive medications.  A BPP performed today was 8 out of 8 for both twin A and  twin B.  There was normal amniotic fluid noted around both twin A  and twin B.  Doppler studies of the umbilical arteries performed today  showed a normal S/D ratio for both twin A and twin B.  (S/D  ratio: A: 2.94, B: 2.65).  There were no signs of absent or  reversed end-diastolic flow noted today in either fetus.  The increased risk of preeclampsia due to the twin gestation  and her history of hypertension was discussed.  The patient  was advised to continue to monitor her blood pressures.  Preeclampsia precautions were reviewed.  Due to IUGR in a twin gestation, delivery will be  recommended at around 36 weeks.  However, delivery prior to 36 weeks may be  recommended  should her blood pressures continue to increase or should  she develop preeclampsia.  She will return in 1 week for another BPP and umbilical artery  Doppler study.  The patient stated that all of her questions were answered  today.  A total of 20 minutes was spent counseling and coordinating  the care for this patient.  Greater than 50% of the time was  spent in direct face-to-face contact. ----------------------------------------------------------------------                   Ma Rings, MD Electronically Signed Final Report   09/17/2023 09:12 am ----------------------------------------------------------------------  [place] MFM UA ADDL GEST Result Date: 09/17/2023 ----------------------------------------------------------------------  OBSTETRICS REPORT                       (Signed Final 09/17/2023 09:12 am) ---------------------------------------------------------------------- Patient Info  ID #:       [id number]                          D.O.B.:  June 26, 1995 (28 yrs)(F)  Name:       [name]             Visit Date: 09/17/2023 07:32 am ---------------------------------------------------------------------- Performed By  Attending:        Ma Rings MD         Ref. Address:     [facility]                                                             [address]                                                             [place], [place],                                                             [place] [address]  Performed By:     Marcellina Millin       Location[facility]         [facility]                    RD[facility]                                     [facility]  Referred By:      Haroldine Laws                    CNM ---------------------------------------------------------------------- Orders  #  Description                           Code  Ordered  By  1  [place] MFM FETAL BPP WO NON               76819.01    RAVI SHANKAR     STRESS  2  [place] MFM FETAL BPP WO NST               76819.1     RAVI SHANKAR     ADDL GESTATION  3  [place] MFM UA CORD DOPPLER                76820.02    RAVI SHANKAR  4  [place] MFM UA ADDL GEST                   76820.01    RAVI SHANKAR ----------------------------------------------------------------------  #  Order #                     Accession #                Episode #  1  [id number]                   [id number]                 [id number]  2  [id number]                   [id number]                 [id number]  3  [id number]                   [id number]                 [id number]  4  [id number]                   [id number]                 [id number] ---------------------------------------------------------------------- Indications  Maternal care for known or suspected poor      O36.5931  fetal growth, third trimester, fetus 1 IUGR  Maternal care for known or suspected poor      O36.5932  fetal growth, third trimester, fetus 2 IUGR  Twin pregnancy, di/di, third trimester         O30.043  Poor obstetrical history (Pre-eclampsia in     O09.299  postpartum)  History of cesarean delivery, currently        O34.219  pregnant  Medical complication of pregnancy (Epilepsy)   O26.90  [redacted] weeks gestation of pregnancy                Z3A.32 ---------------------------------------------------------------------- Vital Signs  BP:          141/92 ---------------------------------------------------------------------- Fetal Evaluation (Fetus A)  Num Of Fetuses:         2  Fetal Heart Rate(bpm):  130  Cardiac Activity:       Observed  Fetal Lie:              Maternal right side  Presentation:           Cephalic  Placenta:               Posterior  P. Cord Insertion:      Previously seen  Membrane Desc:      Dividing Membrane seen - Dichorionic.  Amniotic Fluid  AFI FV:      Within normal limits  Largest Pocket(cm)                              7.06  ---------------------------------------------------------------------- Biophysical Evaluation (Fetus A)  Amniotic F.V:   Pocket => 2 cm             F. Tone:        Observed  F. Movement:    Observed                   Score:          8/8  F. Breathing:   Observed ---------------------------------------------------------------------- OB History  Gravidity:    3         Term:   1         SAB:   1  Living:       1 ---------------------------------------------------------------------- Gestational Age (Fetus A)  LMP:           29w 1d        Date:  02/25/23                   EDD:   12/02/23  Best:          Armida Sans 4d     Det. ByMarcella Dubs         EDD:   11/08/23                                      (03/30/23) ---------------------------------------------------------------------- Doppler - Fetal Vessels (Fetus A)  Umbilical Artery   S/D     %tile      RI    %tile      PI    %tile     PSV    ADFV    RDFV                                                     (cm/s)   2.94       68    0.66       73    1.03       76    67.47      No      No ---------------------------------------------------------------------- Fetal Evaluation (Fetus B)  Num Of Fetuses:         2  Fetal Heart Rate(bpm):  145  Cardiac Activity:       Observed  Fetal Lie:              Maternal left side  Presentation:           Variable  Placenta:               Posterior  P. Cord Insertion:      Previously seen  Membrane Desc:      Dividing Membrane seen - Dichorionic.  Amniotic Fluid  AFI FV:      Within normal limits                              Largest Pocket(cm)  4 ---------------------------------------------------------------------- Biophysical Evaluation (Fetus B)  Amniotic F.V:   Pocket => 2 cm             F. Tone:        Observed  F. Movement:    Observed                   Score:          8/8  F. Breathing:   Observed ---------------------------------------------------------------------- Gestational Age (Fetus B)  LMP:            29w 1d        Date:  02/25/23                   EDD:   12/02/23  Best:          Armida Sans 4d     Det. By:  Marcella Dubs         EDD:   11/08/23                                      (03/30/23) ---------------------------------------------------------------------- Doppler - Fetal Vessels (Fetus B)  Umbilical Artery   S/D     %tile      RI    %tile      PI    %tile     PSV    ADFV    RDFV                                                     (cm/s)   2.65       51    0.62       57    0.93       58     49.3      No      No ---------------------------------------------------------------------- Comments  [name] is currently at 32 weeks and 4 days.  She  has been followed due to a dichorionic, diamniotic twin  gestation with IUGR of twin B.  She denies any problems since her last exam.  She reports  feeling fetal movements of both fetuses throughout the day.  Her blood pressures today were 143/76 and 141/92.  The  patient reports that she was treated with nifedipine during her  prior pregnancy.  She is not currently taking any  antihypertensive medications.  A BPP performed today was 8 out of 8 for both twin A and  twin B.  There was normal amniotic fluid noted around both twin A  and twin B.  Doppler studies of the umbilical arteries performed today  showed a normal S/D ratio for both twin A and twin B.  (S/D  ratio: A: 2.94, B: 2.65).  There were no signs of absent or  reversed end-diastolic flow noted today in either fetus.  The increased risk of preeclampsia due to the twin gestation  and her history of hypertension was discussed.  The patient  was advised to continue to monitor her blood pressures.  Preeclampsia precautions were reviewed.  Due to IUGR in a twin gestation, delivery will be  recommended at around 36 weeks.  However, delivery prior to 36 weeks may be recommended  should her blood pressures continue to increase or should  she develop preeclampsia.  She will return in 1 week for another BPP and  umbilical artery  Doppler study.  The patient stated that all of her questions were answered  today.  A total of 20 minutes was spent counseling and coordinating  the care for this patient.  Greater than 50% of the time was  spent in direct face-to-face contact. ----------------------------------------------------------------------                   Ma Rings, MD Electronically Signed Final Report   09/17/2023 09:12 am ----------------------------------------------------------------------   [place] MFM UA CORD DOPPLER Result Date: 09/09/2023 ----------------------------------------------------------------------  OBSTETRICS REPORT                       (Signed Final 09/09/2023 11:07 am) ---------------------------------------------------------------------- Patient Info  ID #:       [id number]                          D.O.B.:  10-23-1995 (28 yrs)(F)  Name:       [name]             Visit Date: 09/09/2023 08:35 am ---------------------------------------------------------------------- Performed By  Attending:        Lin Landsman      Ref. Address:     [facility]                    MD                                                             [address]                                                             [place], [place],                                                             [place] [address]  Performed By:     Anabel Halon          Location:         [facility]                    RD[facility]                                     [facility]  Referred By:      Haroldine Laws                    CNM ---------------------------------------------------------------------- Orders  #  Description                           Code        Ordered By  1  [place] MFM UA CORD DOPPLER                76820.02    YU FANG  2  [place] MFM UA ADDL GEST                   76820.01    YU  FANG  3  [place] MFM FETAL BPP                      76818.5     RAVI SHANKAR     W/NONSTRESS  4  [place] MFM OB FOLLOW UP                   76816.01    RAVI SHANKAR  5  [place] MFM FETAL BPP                      16109.6     RAVI SHANKAR     W/NONSTRESS ADD'L GEST  6  [place] MFM OB FOLLOW UP ADDL              04540.98    RAVI SHANKAR     GEST ----------------------------------------------------------------------  #  Order #                     Accession #                Episode #  1  [id number]                   [id number]                 [id number]  2  [id number]                   [id number]                 [id number]  3  [id number]                   [id number]                 [id number]  4  [id number]                   [id number]                 [id number]  5  [id number]                   [id number]                 [id number]  6  [id number]                   [id number]                 [id number] ---------------------------------------------------------------------- Indications  Maternal care for known or suspected poor      O36.5931  fetal growth, third trimester, fetus 1 IUGR  Maternal care for known or suspected poor  Z61.0960  fetal growth, third trimester, fetus 2 IUGR  Twin pregnancy, di/di, third trimester         O30.043  [redacted] weeks gestation of pregnancy                Z3A.31  Poor obstetrical history (Pre-e in postpartum) O09.299  History of cesarean delivery, currently        O34.219  pregnant  Medical complication of pregnancy (Epilepsy)   O26.90  Encounter for other antenatal screening        Z36.2  follow-up ---------------------------------------------------------------------- Fetal Evaluation (Fetus A)  Num Of Fetuses:         2  Fetal Heart Rate(bpm):  146  Cardiac Activity:       Observed  Fetal Lie:              Maternal right side  Presentation:           Cephalic  Placenta:               Posterior  P. Cord Insertion:      Previously seen  Membrane Desc:      Dividing Membrane seen - Dichorionic.  Amniotic Fluid  AFI FV:       Within normal limits                              Largest Pocket(cm)                              3.53 ---------------------------------------------------------------------- Biophysical Evaluation (Fetus A)  Amniotic F.V:   Pocket => 2 cm             F. Tone:        Observed  F. Movement:    Observed                   N.S.T:          Reactive  F. Breathing:   Observed                   Score:          10/10 ---------------------------------------------------------------------- Biometry (Fetus A)  BPD:      83.5  mm     G. Age:  33w 4d         93  %    CI:        82.19   %    70 - 86                                                          FL/HC:      19.1   %    19.3 - 21.3  HC:      290.6  mm     G. Age:  32w 0d         28  %    HC/AC:      1.08        0.96 - 1.17  AC:      269.3  mm     G. Age:  31w 0d         35  %    FL/BPD:  66.3   %    71 - 87  FL:       55.4  mm     G. Age:  29w 1d        2.1  %    FL/AC:      20.6   %    20 - 24  Est. FW:    1632  gm    3 lb 10 oz      19  %     FW Discordancy      0 \ 9 % ---------------------------------------------------------------------- OB History  Gravidity:    3         Term:   1         SAB:   1  Living:       1 ---------------------------------------------------------------------- Gestational Age (Fetus A)  LMP:           28w 0d        Date:  02/25/23                   EDD:   12/02/23  U/S Today:     31w 3d                                        EDD:   11/08/23  Best:          31w 3d     Det. By:  Marcella Dubs         EDD:   11/08/23                                      (03/30/23) ---------------------------------------------------------------------- Anatomy (Fetus A)  Stomach:               Appears normal, left   Bladder:                Appears normal                         sided  Kidneys:               Appear normal ---------------------------------------------------------------------- Doppler - Fetal Vessels (Fetus A)  Umbilical Artery   S/D     %tile       RI    %tile      PI    %tile     PSV    ADFV    RDFV                                                     (cm/s)   2.76       52    0.64       59       1       67    41.72      No      No ---------------------------------------------------------------------- Fetal Evaluation (Fetus B)  Num Of Fetuses:         2  Fetal Heart Rate(bpm):  138  Cardiac Activity:       Observed  Fetal Lie:  Maternal left side  Presentation:           Breech  Placenta:               Posterior  P. Cord Insertion:      Previously seen  Membrane Desc:      Dividing Membrane seen - Dichorionic.  Amniotic Fluid  AFI FV:      Within normal limits                              Largest Pocket(cm)                              3.16 ---------------------------------------------------------------------- Biophysical Evaluation (Fetus B)  Amniotic F.V:   Within normal limits       F. Tone:        Observed  F. Movement:    Observed                   N.S.T:          Reactive  F. Breathing:   Observed                   Score:          10/10 ---------------------------------------------------------------------- Biometry (Fetus B)  BPD:      77.1  mm     G. Age:  31w 0d         25  %    CI:        72.24   %    70 - 86                                                          FL/HC:      19.2   %    19.3 - 21.3  HC:      288.6  mm     G. Age:  31w 5d         22  %    HC/AC:      1.12        0.96 - 1.17  AC:      258.6  mm     G. Age:  30w 0d         12  %    FL/BPD:     72.0   %    71 - 87  FL:       55.5  mm     G. Age:  29w 2d        2.2  %    FL/AC:      21.5   %    20 - 24  Est. FW:    1493  gm      3 lb 5 oz      7  %     FW Discordancy         9  % ---------------------------------------------------------------------- Gestational Age (Fetus B)  LMP:           28w 0d        Date:  02/25/23                   EDD:  12/02/23  U/S Today:     30w 4d                                        EDD:   11/14/23  Best:          31w 3d     Det. ByMarcella Dubs         EDD:   11/08/23                                      (03/30/23) ---------------------------------------------------------------------- Anatomy (Fetus B)  Stomach:               Appears normal, left   Bladder:                Appears normal                         sided  Kidneys:               Appear normal ---------------------------------------------------------------------- Doppler - Fetal Vessels (Fetus B)  Umbilical Artery   S/D     %tile      RI    %tile      PI    %tile     PSV    ADFV    RDFV                                                     (cm/s)   4.13       97    0.76       96    1.27       95    53.18      No      No ---------------------------------------------------------------------- Impression  Diamniotic Dichorionic Twin pregnancy sollow up growth due  to prior IUGR in Twin A and B.  Positive interval growth with measurements consistent with  normal EFW twin A (19%) and FGR in Twin B. (EFW 7th%)  Good fetal movement and amniotic fluid volume  Twin discordance is 9%.  The UAD is normal in both twin but elevated in twin B. There  is no evidence of AEDF or REDF in either twin.  Biophysical profile 8/8 for twin A and B.  NST today ---------------------------------------------------------------------- Recommendations  NST or BPP on friday.  Initiate 2x weekly testing with weekly UAD  Repeat growth in 3 weeks. ----------------------------------------------------------------------              Lin Landsman, MD Electronically Signed Final Report   09/09/2023 11:07 am ----------------------------------------------------------------------   [place] MFM UA ADDL GEST Result Date: 09/09/2023 ----------------------------------------------------------------------  OBSTETRICS REPORT                       (Signed Final 09/09/2023 11:07 am) ---------------------------------------------------------------------- Patient Info  ID #:       [id number]                          D.O.B.:  08/15/1995 (28  yrs)(F)  Name:       [name]  Visit Date: 09/09/2023 08:35 am ---------------------------------------------------------------------- Performed By  Attending:        Lin Landsman      Ref. Address:     [facility]                    MD                                                             [address]                                                             [place], [place],                                                             [place] [address]  Performed By:     Anabel Halon          [facility]                                     [facility]  Referred By:      Haroldine Laws                    CNM ---------------------------------------------------------------------- Orders  #  Description                           Code        Ordered By  1  [place] MFM UA CORD DOPPLER                76820.02    YU FANG  2  [place] MFM UA ADDL GEST                   76820.01    YU FANG  3  [place] MFM FETAL BPP  13086.5     RAVI SHANKAR     W/NONSTRESS  4  [place] MFM OB FOLLOW UP                   [id number]    RAVI SHANKAR  5  [place] MFM FETAL BPP                      78469.6     RAVI SHANKAR     W/NONSTRESS ADD'L GEST  6  [place] MFM OB FOLLOW UP ADDL              [id number]    RAVI SHANKAR     GEST ----------------------------------------------------------------------  #  Order #                     Accession #                Episode #  1  [id number]                   [id number]                 [id number]  2  [id number]                   [id number]                 [id number]  3  [id number]                   [id number]                 [id number]  4  [id number]                   [id number]                 [id number]  5  [id number]                   [id number]                 [id number]  6  [id number]                    [id number]                 [id number] ---------------------------------------------------------------------- Indications  Maternal care for known or suspected poor      O36.5931  fetal growth, third trimester, fetus 1 IUGR  Maternal care for known or suspected poor      O36.5932  fetal growth, third trimester, fetus 2 IUGR  Twin pregnancy, di/di, third trimester         O30.043  [redacted] weeks gestation of pregnancy                Z3A.31  Poor obstetrical history (Pre-e in postpartum) O09.299  History of cesarean delivery, currently        O34.219  pregnant  Medical complication of pregnancy (Epilepsy)   O26.90  Encounter for other antenatal screening        Z36.2  follow-up ---------------------------------------------------------------------- Fetal Evaluation (Fetus A)  Num Of Fetuses:         2  Fetal Heart Rate(bpm):  146  Cardiac Activity:       Observed  Fetal Lie:              Maternal right side  Presentation:  Cephalic  Placenta:               Posterior  P. Cord Insertion:      Previously seen  Membrane Desc:      Dividing Membrane seen - Dichorionic.  Amniotic Fluid  AFI FV:      Within normal limits                              Largest Pocket(cm)                              3.53 ---------------------------------------------------------------------- Biophysical Evaluation (Fetus A)  Amniotic F.V:   Pocket => 2 cm             F. Tone:        Observed  F. Movement:    Observed                   N.S.T:          Reactive  F. Breathing:   Observed                   Score:          10/10 ---------------------------------------------------------------------- Biometry (Fetus A)  BPD:      83.5  mm     G. Age:  33w 4d         93  %    CI:        82.19   %    70 - 86                                                          FL/HC:      19.1   %    19.3 - 21.3  HC:      290.6  mm     G. Age:  32w 0d         28  %    HC/AC:      1.08        0.96 - 1.17  AC:      269.3  mm     G. Age:  31w 0d         35  %    FL/BPD:      66.3   %    71 - 87  FL:       55.4  mm     G. Age:  29w 1d        2.1  %    FL/AC:      20.6   %    20 - 24  Est. FW:    1632  gm    3 lb 10 oz      19  %     FW Discordancy      0 \ 9 % ---------------------------------------------------------------------- OB History  Gravidity:    3         Term:   1         SAB:   1  Living:       1 ---------------------------------------------------------------------- Gestational Age (Fetus A)  LMP:           28w 0d  Date:  02/25/23                   EDD:   12/02/23  U/S Today:     31w 3d                                        EDD:   11/08/23  Best:          31w 3d     Det. ByMarcella Dubs         EDD:   11/08/23                                      (03/30/23) ---------------------------------------------------------------------- Anatomy (Fetus A)  Stomach:               Appears normal, left   Bladder:                Appears normal                         sided  Kidneys:               Appear normal ---------------------------------------------------------------------- Doppler - Fetal Vessels (Fetus A)  Umbilical Artery   S/D     %tile      RI    %tile      PI    %tile     PSV    ADFV    RDFV                                                     (cm/s)   2.76       52    0.64       59       1       67    41.72      No      No ---------------------------------------------------------------------- Fetal Evaluation (Fetus B)  Num Of Fetuses:         2  Fetal Heart Rate(bpm):  138  Cardiac Activity:       Observed  Fetal Lie:              Maternal left side  Presentation:           Breech  Placenta:               Posterior  P. Cord Insertion:      Previously seen  Membrane Desc:      Dividing Membrane seen - Dichorionic.  Amniotic Fluid  AFI FV:      Within normal limits                              Largest Pocket(cm)                              3.16 ---------------------------------------------------------------------- Biophysical Evaluation (Fetus B)  Amniotic F.V:    Within normal limits       F. Tone:  Observed  F. Movement:    Observed                   N.S.T:          Reactive  F. Breathing:   Observed                   Score:          10/10 ---------------------------------------------------------------------- Biometry (Fetus B)  BPD:      77.1  mm     G. Age:  31w 0d         25  %    CI:        72.24   %    70 - 86                                                          FL/HC:      19.2   %    19.3 - 21.3  HC:      288.6  mm     G. Age:  31w 5d         22  %    HC/AC:      1.12        0.96 - 1.17  AC:      258.6  mm     G. Age:  30w 0d         12  %    FL/BPD:     72.0   %    71 - 87  FL:       55.5  mm     G. Age:  29w 2d        2.2  %    FL/AC:      21.5   %    20 - 24  Est. FW:    1493  gm      3 lb 5 oz      7  %     FW Discordancy         9  % ---------------------------------------------------------------------- Gestational Age (Fetus B)  LMP:           28w 0d        Date:  02/25/23                   EDD:   12/02/23  U/S Today:     30w 4d                                        EDD:   11/14/23  Best:          31w 3d     Det. ByMarcella Dubs         EDD:   11/08/23                                      (03/30/23) ---------------------------------------------------------------------- Anatomy (Fetus B)  Stomach:               Appears normal, left   Bladder:  Appears normal                         sided  Kidneys:               Appear normal ---------------------------------------------------------------------- Doppler - Fetal Vessels (Fetus B)  Umbilical Artery   S/D     %tile      RI    %tile      PI    %tile     PSV    ADFV    RDFV                                                     (cm/s)   4.13       97    0.76       96    1.27       95    53.18      No      No ---------------------------------------------------------------------- Impression  Diamniotic Dichorionic Twin pregnancy sollow up growth due  to prior IUGR in Twin A and B.  Positive  interval growth with measurements consistent with  normal EFW twin A (19%) and FGR in Twin B. (EFW 7th%)  Good fetal movement and amniotic fluid volume  Twin discordance is 9%.  The UAD is normal in both twin but elevated in twin B. There  is no evidence of AEDF or REDF in either twin.  Biophysical profile 8/8 for twin A and B.  NST today ---------------------------------------------------------------------- Recommendations  NST or BPP on friday.  Initiate 2x weekly testing with weekly UAD  Repeat growth in 3 weeks. ----------------------------------------------------------------------              Lin Landsman, MD Electronically Signed Final Report   09/09/2023 11:07 am ----------------------------------------------------------------------   [place] MFM OB FOLLOW UP Result Date: 09/09/2023 ----------------------------------------------------------------------  OBSTETRICS REPORT                       (Signed Final 09/09/2023 11:07 am) ---------------------------------------------------------------------- Patient Info  ID #:       [id number]                          D.O.B.:  04/20/1996 (28 yrs)(F)  Name:       [name]             Visit Date: 09/09/2023 08:35 am ---------------------------------------------------------------------- Performed By  Attending:        Lin Landsman      Ref. Address:     [facility]                    MD                                                             [address], [place],                                                             [place] [address]  Performed By:     Anabel Halon          Location:         [facility]                    RD[facility]                                     [facility]  Referred By:      Haroldine Laws                    CNM  ---------------------------------------------------------------------- Orders  #  Description                           Code        Ordered By  1  [place] MFM UA CORD DOPPLER                76820.02    YU FANG  2  [place] MFM UA ADDL GEST                   76820.01    YU FANG  3  [place] MFM FETAL BPP                      19147.8     RAVI SHANKAR     W/NONSTRESS  4  [place] MFM OB FOLLOW UP                   [id number]    RAVI SHANKAR  5  [place] MFM FETAL BPP                      29562.1     RAVI SHANKAR     W/NONSTRESS ADD'L GEST  6  [place] MFM OB FOLLOW UP ADDL              30865.78    RAVI SHANKAR     GEST ----------------------------------------------------------------------  #  Order #                     Accession #  Episode #  1  [id number]                   [id number]                 [id number]  2  [id number]                   [id number]                 [id number]  3  [id number]                   [id number]                 [id number]  4  [id number]                   [id number]                 [id number]  5  [id number]                   [id number]                 [id number]  6  [id number]                   [id number]                 [id number] ---------------------------------------------------------------------- Indications  Maternal care for known or suspected poor      O36.5931  fetal growth, third trimester, fetus 1 IUGR  Maternal care for known or suspected poor      O36.5932  fetal growth, third trimester, fetus 2 IUGR  Twin pregnancy, di/di, third trimester         O30.043  [redacted] weeks gestation of pregnancy                Z3A.31  Poor obstetrical history (Pre-e in postpartum) O09.299  History of cesarean delivery, currently        O34.219  pregnant  Medical complication of pregnancy (Epilepsy)   O26.90  Encounter for other antenatal screening        Z36.2  follow-up ---------------------------------------------------------------------- Fetal Evaluation (Fetus A)  Num Of Fetuses:         2  Fetal Heart Rate(bpm):  146  Cardiac  Activity:       Observed  Fetal Lie:              Maternal right side  Presentation:           Cephalic  Placenta:               Posterior  P. Cord Insertion:      Previously seen  Membrane Desc:      Dividing Membrane seen - Dichorionic.  Amniotic Fluid  AFI FV:      Within normal limits                              Largest Pocket(cm)                              3.53 ---------------------------------------------------------------------- Biophysical Evaluation (Fetus A)  Amniotic F.V:   Pocket => 2 cm             F. Tone:        Observed  F. Movement:    Observed  N.S.T:          Reactive  F. Breathing:   Observed                   Score:          10/10 ---------------------------------------------------------------------- Biometry (Fetus A)  BPD:      83.5  mm     G. Age:  33w 4d         93  %    CI:        82.19   %    70 - 86                                                          FL/HC:      19.1   %    19.3 - 21.3  HC:      290.6  mm     G. Age:  32w 0d         28  %    HC/AC:      1.08        0.96 - 1.17  AC:      269.3  mm     G. Age:  31w 0d         35  %    FL/BPD:     66.3   %    71 - 87  FL:       55.4  mm     G. Age:  29w 1d        2.1  %    FL/AC:      20.6   %    20 - 24  Est. FW:    1632  gm    3 lb 10 oz      19  %     FW Discordancy      0 \ 9 % ---------------------------------------------------------------------- OB History  Gravidity:    3         Term:   1         SAB:   1  Living:       1 ---------------------------------------------------------------------- Gestational Age (Fetus A)  LMP:           28w 0d        Date:  02/25/23                   EDD:   12/02/23  U/S Today:     31w 3d                                        EDD:   11/08/23  Best:          31w 3d     Det. ByMarcella Dubs         EDD:   11/08/23                                      (03/30/23) ---------------------------------------------------------------------- Anatomy (Fetus A)  Stomach:               Appears  normal,  left   Bladder:                Appears normal                         sided  Kidneys:               Appear normal ---------------------------------------------------------------------- Doppler - Fetal Vessels (Fetus A)  Umbilical Artery   S/D     %tile      RI    %tile      PI    %tile     PSV    ADFV    RDFV                                                     (cm/s)   2.76       52    0.64       59       1       67    41.72      No      No ---------------------------------------------------------------------- Fetal Evaluation (Fetus B)  Num Of Fetuses:         2  Fetal Heart Rate(bpm):  138  Cardiac Activity:       Observed  Fetal Lie:              Maternal left side  Presentation:           Breech  Placenta:               Posterior  P. Cord Insertion:      Previously seen  Membrane Desc:      Dividing Membrane seen - Dichorionic.  Amniotic Fluid  AFI FV:      Within normal limits                              Largest Pocket(cm)                              3.16 ---------------------------------------------------------------------- Biophysical Evaluation (Fetus B)  Amniotic F.V:   Within normal limits       F. Tone:        Observed  F. Movement:    Observed                   N.S.T:          Reactive  F. Breathing:   Observed                   Score:          10/10 ---------------------------------------------------------------------- Biometry (Fetus B)  BPD:      77.1  mm     G. Age:  31w 0d         25  %    CI:        72.24   %    70 - 86  FL/HC:      19.2   %    19.3 - 21.3  HC:      288.6  mm     G. Age:  31w 5d         22  %    HC/AC:      1.12        0.96 - 1.17  AC:      258.6  mm     G. Age:  30w 0d         12  %    FL/BPD:     72.0   %    71 - 87  FL:       55.5  mm     G. Age:  29w 2d        2.2  %    FL/AC:      21.5   %    20 - 24  Est. FW:    1493  gm      3 lb 5 oz      7  %     FW Discordancy         9  %  ---------------------------------------------------------------------- Gestational Age (Fetus B)  LMP:           28w 0d        Date:  02/25/23                   EDD:   12/02/23  U/S Today:     30w 4d                                        EDD:   11/14/23  Best:          31w 3d     Det. By:  Marcella Dubs         EDD:   11/08/23                                      (03/30/23) ---------------------------------------------------------------------- Anatomy (Fetus B)  Stomach:               Appears normal, left   Bladder:                Appears normal                         sided  Kidneys:               Appear normal ---------------------------------------------------------------------- Doppler - Fetal Vessels (Fetus B)  Umbilical Artery   S/D     %tile      RI    %tile      PI    %tile     PSV    ADFV    RDFV                                                     (cm/s)   4.13       97    0.76       96    1.27  95    53.18      No      No ---------------------------------------------------------------------- Impression  Diamniotic Dichorionic Twin pregnancy sollow up growth due  to prior IUGR in Twin A and B.  Positive interval growth with measurements consistent with  normal EFW twin A (19%) and FGR in Twin B. (EFW 7th%)  Good fetal movement and amniotic fluid volume  Twin discordance is 9%.  The UAD is normal in both twin but elevated in twin B. There  is no evidence of AEDF or REDF in either twin.  Biophysical profile 8/8 for twin A and B.  NST today ---------------------------------------------------------------------- Recommendations  NST or BPP on friday.  Initiate 2x weekly testing with weekly UAD  Repeat growth in 3 weeks. ----------------------------------------------------------------------              Lin Landsman, MD Electronically Signed Final Report   09/09/2023 11:07 am ----------------------------------------------------------------------   [place] MFM OB FOLLOW UP ADDL GEST Result Date:  09/09/2023 ----------------------------------------------------------------------  OBSTETRICS REPORT                       (Signed Final 09/09/2023 11:07 am) ---------------------------------------------------------------------- Patient Info  ID #:       [id number]                          D.O.B.:  1995/11/07 (28 yrs)(F)  Name:       [name]             Visit Date: 09/09/2023 08:35 am ---------------------------------------------------------------------- Performed By  Attending:        Lin Landsman      Ref. Address:     [facility]                    MD                                                             [address]                                                             [place], [place],                                                             [place] [address]  Performed By:     Anabel Halon          Location[facility]                                     [facility]  Referred By:      Haroldine Laws                    CNM ---------------------------------------------------------------------- Orders  #  Description                           Code        Ordered By  1  [place] MFM UA CORD DOPPLER                76820.02    YU FANG  2  [place] MFM UA ADDL GEST                   76820.01    YU FANG  3  [place] MFM FETAL BPP                      76818.5     RAVI SHANKAR     W/NONSTRESS  4  [place] MFM OB FOLLOW UP                   76816.01    RAVI SHANKAR  5  [place] MFM FETAL BPP                      60454.0     RAVI SHANKAR     W/NONSTRESS ADD'L GEST  6  [place] MFM OB FOLLOW UP ADDL              98119.14    RAVI SHANKAR     GEST ----------------------------------------------------------------------  #  Order #                     Accession #                Episode #  1  [id number]                   [id number]                 [id number]   2  [id number]                   [id number]                 [id number]  3  [id number]                   [id number]                 [id number]  4  [id number]                   [id number]                 [id number]  5  [id number]                   [id number]                 [id number]  6  [id number]  [id number]                 [id number] ---------------------------------------------------------------------- Indications  Maternal care for known or suspected poor      O36.5931  fetal growth, third trimester, fetus 1 IUGR  Maternal care for known or suspected poor      O36.5932  fetal growth, third trimester, fetus 2 IUGR  Twin pregnancy, di/di, third trimester         O30.043  [redacted] weeks gestation of pregnancy                Z3A.31  Poor obstetrical history (Pre-e in postpartum) O09.299  History of cesarean delivery, currently        O34.219  pregnant  Medical complication of pregnancy (Epilepsy)   O26.90  Encounter for other antenatal screening        Z36.2  follow-up ---------------------------------------------------------------------- Fetal Evaluation (Fetus A)  Num Of Fetuses:         2  Fetal Heart Rate(bpm):  146  Cardiac Activity:       Observed  Fetal Lie:              Maternal right side  Presentation:           Cephalic  Placenta:               Posterior  P. Cord Insertion:      Previously seen  Membrane Desc:      Dividing Membrane seen - Dichorionic.  Amniotic Fluid  AFI FV:      Within normal limits                              Largest Pocket(cm)                              3.53 ---------------------------------------------------------------------- Biophysical Evaluation (Fetus A)  Amniotic F.V:   Pocket => 2 cm             F. Tone:        Observed  F. Movement:    Observed                   N.S.T:          Reactive  F. Breathing:   Observed                   Score:          10/10 ---------------------------------------------------------------------- Biometry (Fetus A)  BPD:      83.5  mm     G. Age:   33w 4d         93  %    CI:        82.19   %    70 - 86                                                          FL/HC:      19.1   %    19.3 - 21.3  HC:      290.6  mm     G. Age:  32w 0d         28  %  HC/AC:      1.08        0.96 - 1.17  AC:      269.3  mm     G. Age:  31w 0d         35  %    FL/BPD:     66.3   %    71 - 87  FL:       55.4  mm     G. Age:  29w 1d        2.1  %    FL/AC:      20.6   %    20 - 24  Est. FW:    1632  gm    3 lb 10 oz      19  %     FW Discordancy      0 \ 9 % ---------------------------------------------------------------------- OB History  Gravidity:    3         Term:   1         SAB:   1  Living:       1 ---------------------------------------------------------------------- Gestational Age (Fetus A)  LMP:           28w 0d        Date:  02/25/23                   EDD:   12/02/23  U/S Today:     31w 3d                                        EDD:   11/08/23  Best:          31w 3d     Det. By:  Marcella Dubs         EDD:   11/08/23                                      (03/30/23) ---------------------------------------------------------------------- Anatomy (Fetus A)  Stomach:               Appears normal, left   Bladder:                Appears normal                         sided  Kidneys:               Appear normal ---------------------------------------------------------------------- Doppler - Fetal Vessels (Fetus A)  Umbilical Artery   S/D     %tile      RI    %tile      PI    %tile     PSV    ADFV    RDFV                                                     (cm/s)   2.76       52    0.64       59       1       67    41.72  No      No ---------------------------------------------------------------------- Fetal Evaluation (Fetus B)  Num Of Fetuses:         2  Fetal Heart Rate(bpm):  138  Cardiac Activity:       Observed  Fetal Lie:              Maternal left side  Presentation:           Breech  Placenta:               Posterior  P. Cord Insertion:      Previously seen  Membrane  Desc:      Dividing Membrane seen - Dichorionic.  Amniotic Fluid  AFI FV:      Within normal limits                              Largest Pocket(cm)                              3.16 ---------------------------------------------------------------------- Biophysical Evaluation (Fetus B)  Amniotic F.V:   Within normal limits       F. Tone:        Observed  F. Movement:    Observed                   N.S.T:          Reactive  F. Breathing:   Observed                   Score:          10/10 ---------------------------------------------------------------------- Biometry (Fetus B)  BPD:      77.1  mm     G. Age:  31w 0d         25  %    CI:        72.24   %    70 - 86                                                          FL/HC:      19.2   %    19.3 - 21.3  HC:      288.6  mm     G. Age:  31w 5d         22  %    HC/AC:      1.12        0.96 - 1.17  AC:      258.6  mm     G. Age:  30w 0d         12  %    FL/BPD:     72.0   %    71 - 87  FL:       55.5  mm     G. Age:  29w 2d        2.2  %    FL/AC:      21.5   %    20 - 24  Est. FW:    1493  gm      3 lb 5 oz      7  %     FW Discordancy  9  % ---------------------------------------------------------------------- Gestational Age (Fetus B)  LMP:           28w 0d        Date:  02/25/23                   EDD:   12/02/23  U/S Today:     30w 4d                                        EDD:   11/14/23  Best:          31w 3d     Det. ByMarcella Dubs         EDD:   11/08/23                                      (03/30/23) ---------------------------------------------------------------------- Anatomy (Fetus B)  Stomach:               Appears normal, left   Bladder:                Appears normal                         sided  Kidneys:               Appear normal ---------------------------------------------------------------------- Doppler - Fetal Vessels (Fetus B)  Umbilical Artery   S/D     %tile      RI    %tile      PI    %tile     PSV    ADFV    RDFV                                                      (cm/s)   4.13       97    0.76       96    1.27       95    53.18      No      No ---------------------------------------------------------------------- Impression  Diamniotic Dichorionic Twin pregnancy sollow up growth due  to prior IUGR in Twin A and B.  Positive interval growth with measurements consistent with  normal EFW twin A (19%) and FGR in Twin B. (EFW 7th%)  Good fetal movement and amniotic fluid volume  Twin discordance is 9%.  The UAD is normal in both twin but elevated in twin B. There  is no evidence of AEDF or REDF in either twin.  Biophysical profile 8/8 for twin A and B.  NST today ---------------------------------------------------------------------- Recommendations  NST or BPP on friday.  Initiate 2x weekly testing with weekly UAD  Repeat growth in 3 weeks. ----------------------------------------------------------------------              Lin Landsman, MD Electronically Signed Final Report   09/09/2023 11:07 am ----------------------------------------------------------------------   [place] MFM FETAL BPP W/NONSTRESS Result Date: 09/09/2023 ----------------------------------------------------------------------  OBSTETRICS REPORT                       (Signed Final 09/09/2023 11:07 am) ---------------------------------------------------------------------- Patient  Info  ID #:       [id number]                          D.O.B.:  1996-04-03 (28 yrs)(F)  Name:       [name]             Visit Date: 09/09/2023 08:35 am ---------------------------------------------------------------------- Performed By  Attending:        Lin Landsman      Ref. Address:     [facility]                    MD                                                             [address]                                                             [place], [place],                                                             [place] [address]  Performed By:     Anabel Halon           Location:         [facility]                    RD[facility]                                     [facility]  Referred By:      Haroldine Laws                    CNM ---------------------------------------------------------------------- Orders  #  Description                           Code        Ordered By  1  [place] MFM UA CORD DOPPLER  40981.19    YU FANG  2  [place] MFM UA ADDL GEST                   76820.01    YU FANG  3  [place] MFM FETAL BPP                      76818.5     RAVI SHANKAR     W/NONSTRESS  4  [place] MFM OB FOLLOW UP                   76816.01    RAVI SHANKAR  5  [place] MFM FETAL BPP                      14782.9     RAVI SHANKAR     W/NONSTRESS ADD'L GEST  6  [place] MFM OB FOLLOW UP ADDL              [id number]    RAVI SHANKAR     GEST ----------------------------------------------------------------------  #  Order #                     Accession #                Episode #  1  [id number]                   [id number]                 [id number]  2  [id number]                   [id number]                 [id number]  3  [id number]                   [id number]                 [id number]  4  [id number]                   [id number]                 [id number]  5  [id number]                   [id number]                 [id number]  6  [id number]                   [id number]                 [id number] ---------------------------------------------------------------------- Indications  Maternal care for known or suspected poor      O36.5931  fetal growth, third trimester, fetus 1 IUGR  Maternal care for known or suspected poor      O36.5932  fetal growth, third trimester, fetus 2 IUGR  Twin pregnancy, di/di, third trimester         O30.043  [redacted] weeks gestation of pregnancy                Z3A.31  Poor obstetrical history (Pre-e in postpartum) O09.299  History of cesarean delivery, currently         O34.219  pregnant  Medical complication of pregnancy (Epilepsy)   O26.90  Encounter for other antenatal screening  Z36.2  follow-up ---------------------------------------------------------------------- Fetal Evaluation (Fetus A)  Num Of Fetuses:         2  Fetal Heart Rate(bpm):  146  Cardiac Activity:       Observed  Fetal Lie:              Maternal right side  Presentation:           Cephalic  Placenta:               Posterior  P. Cord Insertion:      Previously seen  Membrane Desc:      Dividing Membrane seen - Dichorionic.  Amniotic Fluid  AFI FV:      Within normal limits                              Largest Pocket(cm)                              3.53 ---------------------------------------------------------------------- Biophysical Evaluation (Fetus A)  Amniotic F.V:   Pocket => 2 cm             F. Tone:        Observed  F. Movement:    Observed                   N.S.T:          Reactive  F. Breathing:   Observed                   Score:          10/10 ---------------------------------------------------------------------- Biometry (Fetus A)  BPD:      83.5  mm     G. Age:  33w 4d         93  %    CI:        82.19   %    70 - 86                                                          FL/HC:      19.1   %    19.3 - 21.3  HC:      290.6  mm     G. Age:  32w 0d         28  %    HC/AC:      1.08        0.96 - 1.17  AC:      269.3  mm     G. Age:  31w 0d         35  %    FL/BPD:     66.3   %    71 - 87  FL:       55.4  mm     G. Age:  29w 1d        2.1  %    FL/AC:      20.6   %    20 - 24  Est. FW:    1632  gm    3 lb 10 oz      19  %     FW Discordancy  0 \ 9 % ---------------------------------------------------------------------- OB History  Gravidity:    3         Term:   1         SAB:   1  Living:       1 ---------------------------------------------------------------------- Gestational Age (Fetus A)  LMP:           28w 0d        Date:  02/25/23                   EDD:   12/02/23  U/S Today:     31w  3d                                        EDD:   11/08/23  Best:          31w 3d     Det. ByMarcella Dubs         EDD:   11/08/23                                      (03/30/23) ---------------------------------------------------------------------- Anatomy (Fetus A)  Stomach:               Appears normal, left   Bladder:                Appears normal                         sided  Kidneys:               Appear normal ---------------------------------------------------------------------- Doppler - Fetal Vessels (Fetus A)  Umbilical Artery   S/D     %tile      RI    %tile      PI    %tile     PSV    ADFV    RDFV                                                     (cm/s)   2.76       52    0.64       59       1       67    41.72      No      No ---------------------------------------------------------------------- Fetal Evaluation (Fetus B)  Num Of Fetuses:         2  Fetal Heart Rate(bpm):  138  Cardiac Activity:       Observed  Fetal Lie:              Maternal left side  Presentation:           Breech  Placenta:               Posterior  P. Cord Insertion:      Previously seen  Membrane Desc:      Dividing Membrane seen - Dichorionic.  Amniotic Fluid  AFI FV:      Within normal limits  Largest Pocket(cm)                              3.16 ---------------------------------------------------------------------- Biophysical Evaluation (Fetus B)  Amniotic F.V:   Within normal limits       F. Tone:        Observed  F. Movement:    Observed                   N.S.T:          Reactive  F. Breathing:   Observed                   Score:          10/10 ---------------------------------------------------------------------- Biometry (Fetus B)  BPD:      77.1  mm     G. Age:  31w 0d         25  %    CI:        72.24   %    70 - 86                                                          FL/HC:      19.2   %    19.3 - 21.3  HC:      288.6  mm     G. Age:  31w 5d         22  %    HC/AC:      1.12         0.96 - 1.17  AC:      258.6  mm     G. Age:  30w 0d         12  %    FL/BPD:     72.0   %    71 - 87  FL:       55.5  mm     G. Age:  29w 2d        2.2  %    FL/AC:      21.5   %    20 - 24  Est. FW:    1493  gm      3 lb 5 oz      7  %     FW Discordancy         9  % ---------------------------------------------------------------------- Gestational Age (Fetus B)  LMP:           28w 0d        Date:  02/25/23                   EDD:   12/02/23  U/S Today:     30w 4d                                        EDD:   11/14/23  Best:          31w 3d     Det. ByMarcella Dubs         EDD:   11/08/23                                      (  03/30/23) ---------------------------------------------------------------------- Anatomy (Fetus B)  Stomach:               Appears normal, left   Bladder:                Appears normal                         sided  Kidneys:               Appear normal ---------------------------------------------------------------------- Doppler - Fetal Vessels (Fetus B)  Umbilical Artery   S/D     %tile      RI    %tile      PI    %tile     PSV    ADFV    RDFV                                                     (cm/s)   4.13       97    0.76       96    1.27       95    53.18      No      No ---------------------------------------------------------------------- Impression  Diamniotic Dichorionic Twin pregnancy sollow up growth due  to prior IUGR in Twin A and B.  Positive interval growth with measurements consistent with  normal EFW twin A (19%) and FGR in Twin B. (EFW 7th%)  Good fetal movement and amniotic fluid volume  Twin discordance is 9%.  The UAD is normal in both twin but elevated in twin B. There  is no evidence of AEDF or REDF in either twin.  Biophysical profile 8/8 for twin A and B.  NST today ---------------------------------------------------------------------- Recommendations  NST or BPP on friday.  Initiate 2x weekly testing with weekly UAD  Repeat growth in 3 weeks.  ----------------------------------------------------------------------              Lin Landsman, MD Electronically Signed Final Report   09/09/2023 11:07 am ----------------------------------------------------------------------   [place] MFM FETAL BPP W/NONSTRESS ADD'L GEST Result Date: 09/09/2023 ----------------------------------------------------------------------  OBSTETRICS REPORT                       (Signed Final 09/09/2023 11:07 am) ---------------------------------------------------------------------- Patient Info  ID #:       [id number]                          D.O.B.:  1995/08/03 (28 yrs)(F)  Name:       [name]             Visit Date: 09/09/2023 08:35 am ---------------------------------------------------------------------- Performed By  Attending:        Lin Landsman      Ref. Address:     [facility]                    MD  [address], [place],                                                             [place] [address]  Performed By:     Anabel Halon          Location[facility]                    RD[facility]                                     [facility]  Referred By:      Haroldine Laws                    CNM ---------------------------------------------------------------------- Orders  #  Description                           Code        Ordered By  1  [place] MFM UA CORD DOPPLER                76820.02    YU FANG  2  [place] MFM UA ADDL GEST                   76820.01    YU FANG  3  [place] MFM FETAL BPP                      91478.2     RAVI SHANKAR     W/NONSTRESS  4  [place] MFM OB FOLLOW UP                   76816.01    RAVI SHANKAR  5  [place] MFM FETAL BPP                      95621.3     RAVI SHANKAR     W/NONSTRESS ADD'L  GEST  6  [place] MFM OB FOLLOW UP ADDL  40981.19    RAVI SHANKAR     GEST ----------------------------------------------------------------------  #  Order #                     Accession #                Episode #  1  [id number]                   [id number]                 [id number]  2  [id number]                   [id number]                 [id number]  3  [id number]                   [id number]                 [id number]  4  [id number]                   [id number]                 [id number]  5  [id number]                   [id number]                 [id number]  6  [id number]                   [id number]                 [id number] ---------------------------------------------------------------------- Indications  Maternal care for known or suspected poor      O36.5931  fetal growth, third trimester, fetus 1 IUGR  Maternal care for known or suspected poor      O36.5932  fetal growth, third trimester, fetus 2 IUGR  Twin pregnancy, di/di, third trimester         O30.043  [redacted] weeks gestation of pregnancy                Z3A.31  Poor obstetrical history (Pre-e in postpartum) O09.299  History of cesarean delivery, currently        O34.219  pregnant  Medical complication of pregnancy (Epilepsy)   O26.90  Encounter for other antenatal screening        Z36.2  follow-up ---------------------------------------------------------------------- Fetal Evaluation (Fetus A)  Num Of Fetuses:         2  Fetal Heart Rate(bpm):  146  Cardiac Activity:       Observed  Fetal Lie:              Maternal right side  Presentation:           Cephalic  Placenta:               Posterior  P. Cord Insertion:      Previously seen  Membrane Desc:      Dividing Membrane seen - Dichorionic.  Amniotic Fluid  AFI FV:      Within normal limits                              Largest Pocket(cm)  3.53 ---------------------------------------------------------------------- Biophysical Evaluation (Fetus A)  Amniotic F.V:   Pocket => 2 cm              F. Tone:        Observed  F. Movement:    Observed                   N.S.T:          Reactive  F. Breathing:   Observed                   Score:          10/10 ---------------------------------------------------------------------- Biometry (Fetus A)  BPD:      83.5  mm     G. Age:  33w 4d         93  %    CI:        82.19   %    70 - 86                                                          FL/HC:      19.1   %    19.3 - 21.3  HC:      290.6  mm     G. Age:  32w 0d         28  %    HC/AC:      1.08        0.96 - 1.17  AC:      269.3  mm     G. Age:  31w 0d         35  %    FL/BPD:     66.3   %    71 - 87  FL:       55.4  mm     G. Age:  29w 1d        2.1  %    FL/AC:      20.6   %    20 - 24  Est. FW:    1632  gm    3 lb 10 oz      19  %     FW Discordancy      0 \ 9 % ---------------------------------------------------------------------- OB History  Gravidity:    3         Term:   1         SAB:   1  Living:       1 ---------------------------------------------------------------------- Gestational Age (Fetus A)  LMP:           28w 0d        Date:  02/25/23                   EDD:   12/02/23  U/S Today:     31w 3d                                        EDD:   11/08/23  Best:          31w 3d     Det. By:  Marcella Dubs         EDD:  11/08/23                                      (03/30/23) ---------------------------------------------------------------------- Anatomy (Fetus A)  Stomach:               Appears normal, left   Bladder:                Appears normal                         sided  Kidneys:               Appear normal ---------------------------------------------------------------------- Doppler - Fetal Vessels (Fetus A)  Umbilical Artery   S/D     %tile      RI    %tile      PI    %tile     PSV    ADFV    RDFV                                                     (cm/s)   2.76       52    0.64       59       1       67    41.72      No      No  ---------------------------------------------------------------------- Fetal Evaluation (Fetus B)  Num Of Fetuses:         2  Fetal Heart Rate(bpm):  138  Cardiac Activity:       Observed  Fetal Lie:              Maternal left side  Presentation:           Breech  Placenta:               Posterior  P. Cord Insertion:      Previously seen  Membrane Desc:      Dividing Membrane seen - Dichorionic.  Amniotic Fluid  AFI FV:      Within normal limits                              Largest Pocket(cm)                              3.16 ---------------------------------------------------------------------- Biophysical Evaluation (Fetus B)  Amniotic F.V:   Within normal limits       F. Tone:        Observed  F. Movement:    Observed                   N.S.T:          Reactive  F. Breathing:   Observed                   Score:          10/10 ---------------------------------------------------------------------- Biometry (Fetus B)  BPD:      77.1  mm     G. Age:  31w 0d         25  %  CI:        72.24   %    70 - 86                                                          FL/HC:      19.2   %    19.3 - 21.3  HC:      288.6  mm     G. Age:  31w 5d         22  %    HC/AC:      1.12        0.96 - 1.17  AC:      258.6  mm     G. Age:  30w 0d         12  %    FL/BPD:     72.0   %    71 - 87  FL:       55.5  mm     G. Age:  29w 2d        2.2  %    FL/AC:      21.5   %    20 - 24  Est. FW:    1493  gm      3 lb 5 oz      7  %     FW Discordancy         9  % ---------------------------------------------------------------------- Gestational Age (Fetus B)  LMP:           28w 0d        Date:  02/25/23                   EDD:   12/02/23  U/S Today:     30w 4d                                        EDD:   11/14/23  Best:          31w 3d     Det. By:  Marcella Dubs         EDD:   11/08/23                                      (03/30/23) ---------------------------------------------------------------------- Anatomy (Fetus B)  Stomach:                Appears normal, left   Bladder:                Appears normal                         sided  Kidneys:               Appear normal ---------------------------------------------------------------------- Doppler - Fetal Vessels (Fetus B)  Umbilical Artery   S/D     %tile      RI    %tile      PI    %tile     PSV    ADFV    RDFV                                                     (  cm/s)   4.13       97    0.76       96    1.27       95    53.18      No      No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

## 2023-09-25 NOTE — OB Triage Note (Signed)
 28 y.o G3P1 presents to L&D triage for elevated BP's from her office visit today. She denies headache, epigastric pain, abnormal swelling, vision disturbances and endorses good fetal movement for Di/Di xx twins. Reflexes are +2, no clonus detected. IV in place, BP's elevated but not over 160/110. Monitors applied and assessing with category 1 strip. BP's cycling Q15. [facility] CNM aware of pt arrival.

## 2023-09-26 DIAGNOSIS — O365930 Maternal care for other known or suspected poor fetal growth, third trimester, not applicable or unspecified: Secondary | ICD-10-CM

## 2023-09-26 DIAGNOSIS — Z3A33 33 weeks gestation of pregnancy: Secondary | ICD-10-CM

## 2023-09-26 DIAGNOSIS — O34219 Maternal care for unspecified type scar from previous cesarean delivery: Secondary | ICD-10-CM

## 2023-09-26 DIAGNOSIS — O1493 Unspecified pre-eclampsia, third trimester: Secondary | ICD-10-CM

## 2023-09-26 DIAGNOSIS — O09293 Supervision of pregnancy with other poor reproductive or obstetric history, third trimester: Secondary | ICD-10-CM

## 2023-09-26 DIAGNOSIS — O30043 Twin pregnancy, dichorionic/diamniotic, third trimester: Secondary | ICD-10-CM

## 2023-09-26 LAB — COMPREHENSIVE METABOLIC PANEL WITH GFR
ALT: 11 U/L (ref 0–44)
AST: 20 U/L (ref 15–41)
Albumin: 2.8 g/dL — ABNORMAL LOW (ref 3.5–5.0)
Alkaline Phosphatase: 101 U/L (ref 38–126)
Anion gap: 9 (ref 5–15)
BUN: 8 mg/dL (ref 6–20)
CO2: 22 mmol/L (ref 22–32)
Calcium: 9.1 mg/dL (ref 8.9–10.3)
Chloride: 105 mmol/L (ref 98–111)
Creatinine, Ser: 0.38 mg/dL — ABNORMAL LOW (ref 0.44–1.00)
GFR, Estimated: >60 mL/min (ref >60)
Glucose, Bld: 116 mg/dL — ABNORMAL HIGH (ref 70–99)
Potassium: 4 mmol/L (ref 3.5–5.1)
Sodium: 136 mmol/L (ref 135–145)
Total Bilirubin: 0.7 mg/dL (ref 0.0–1.2)
Total Protein: 6.5 g/dL (ref 6.5–8.1)

## 2023-09-26 LAB — PREPARE RBC (CROSSMATCH)

## 2023-09-26 LAB — CBC
HCT: 23.5 % — ABNORMAL LOW (ref 36.0–46.0)
Hemoglobin: 7.7 g/dL — ABNORMAL LOW (ref 12.0–15.0)
MCH: 23.9 pg — ABNORMAL LOW (ref 26.0–34.0)
MCHC: 32.8 g/dL (ref 30.0–36.0)
MCV: 73 fL — ABNORMAL LOW (ref 80.0–100.0)
Platelets: 202 10*3/uL (ref 150–400)
RBC: 3.22 MIL/uL — ABNORMAL LOW (ref 3.87–5.11)
RDW: 15.5 % (ref 11.5–15.5)
WBC: 12.1 10*3/uL — ABNORMAL HIGH (ref 4.0–10.5)
nRBC: 0.9 % — ABNORMAL HIGH (ref 0.0–0.2)

## 2023-09-26 LAB — PROTEIN, URINE, 24 HOUR
Collection Interval-UPROT: 24 h
Protein, 24H Urine: 810 mg/d — ABNORMAL HIGH (ref 50–100)
Protein, Urine: 30 mg/dL
Urine Total Volume-UPROT: 2700 mL

## 2023-09-26 LAB — RPR: RPR Ser Ql: NONREACTIVE

## 2023-09-26 MED ORDER — IRON SUCROSE 300 MG IVPB - SIMPLE MED
300.0000 mg | Freq: Once | Status: DC
  Filled 2023-09-26: qty 265

## 2023-09-26 MED ORDER — SODIUM CHLORIDE 0.9% IV SOLUTION: Freq: Once | INTRAVENOUS | Status: AC
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
## 2023-09-26 NOTE — Progress Notes (Signed)
 Patient ID: [name], female   DOB: 12/02/1995, 28 y.o.   MRN: [id number] Records reviewed for this di/di gestation at 33+6 weeks with VTx/ breech twins . IUGR baby B at 7% EFW. MFM performed doppler studies on this patient Monday with nl results .    BP have elevated with sporadic systolic reaching 160 . No rx  She has not made the criteria for preeclampsia with severe features. If she does I recommend delivery >= 34 +0 weeks . IF not will try to get her to 35 weeks by MFM recommendations   Her H/H is extremely low 7.7/ 23.5 and she has received iron transfusion yesterday  .  I have spoken to her about receiving blood before entering into the delivery process where significant blood could occur and her life could be jeopardized. She seems to understand the risks and will think about after speaking with her family and will let us know .   Currently fetal monitoring for fetus A+B reassuring  Repeat labs in am   Second steroid injection tonight

## 2023-09-26 NOTE — Consult Note (Signed)
 MFM Consult Note  [name] is currently at 33 weeks and 6 days.  She has been followed due to a dichorionic, diamniotic twin gestation with IUGR of twin B.    The patient was admitted yesterday due to preeclampsia.  Her blood pressures have been elevated over the past week.    She presented to the hospital yesterday because she felt something was not right.  On admission, she was noted to have severe range blood pressures in the 170s to 180s over 100s range.    Due to preeclampsia and the twin gestation, she was admitted to the hospital and given a complete course of antenatal corticosteroids.    Her PIH labs on admission were all within normal limits.    Her P/C ratio was 0.79 indicating significant proteinuria and confirming the diagnosis of preeclampsia.  She currently has a 24-hour urine collection pending.    Her blood work at the time of admission also indicated significant maternal anemia with an H&H of 7.7 and 23.5.  The patient received IV iron yesterday.  She had a growth scan performed on March 18 showing an EFW of 3 pounds 10 ounces (19th percentile) for twin A and 3 pounds 5 ounces (7 percentile) for twin B.    Her umbilical artery Doppler studies have showed continued normal forward flow without any signs of absent or reversed end-diastolic flow.  Both fetuses were noted to be in the vertex/vertex presentations last week.  I connected with the patient via telephone today.    Currently, she denies any signs or symptoms of preeclampsia.  She reports fetal movements of both fetuses.  Her blood pressures today have been in the 140s to 150s over 70s to 90s range.  The implications and management of preeclampsia was discussed with the patient.    She was advised that in the mother, preeclampsia may cause seizures, and affect her kidney function, her liver function, and may decrease her platelet counts.  It may also cause the mother to have severely elevated blood  pressures.    In the fetus, preeclampsia may cause fetal growth restriction and lower the amniotic fluid levels.    She understands that delivery of the baby and the placenta is the only treatment for preeclampsia.  Due to preeclampsia and a dichorionic twin gestation, inpatient management until delivery is recommended.    She should receive a complete course of antenatal corticosteroids.    The goal for her delivery would be at around 35 weeks.    However, delivery prior to 35 weeks is recommended:     -should her blood pressures be persistently in the severe range (greater than 150/100s) -should she complain of any signs or symptoms of preeclampsia -should her PIH labs show any abnormalities -at any time for nonreassuring fetal status  As her PIH labs today were within normal limits, her PIH labs should be repeated once every 3 days.    We will perform a growth scan and umbilical artery Doppler studies next week when we are at [facility].    The patient understands that due to significant maternal anemia, she may require blood transfusion after delivery.  She would only accept a blood transfusion as a last resort.  Due to preeclampsia, she should receive magnesium sulfate for maternal seizure prophylaxis for 24 hours after delivery.  We will continue to follow her closely with you.    The patient stated that all of her questions were answered today.

## 2023-09-27 LAB — CBC
HCT: 24.2 % — ABNORMAL LOW (ref 36.0–46.0)
HCT: 24.3 % — ABNORMAL LOW (ref 36.0–46.0)
Hemoglobin: 7.8 g/dL — ABNORMAL LOW (ref 12.0–15.0)
Hemoglobin: 7.9 g/dL — ABNORMAL LOW (ref 12.0–15.0)
MCH: 24.8 pg — ABNORMAL LOW (ref 26.0–34.0)
MCH: 24.1 pg — ABNORMAL LOW (ref 26.0–34.0)
MCHC: 32.2 g/dL (ref 30.0–36.0)
MCHC: 32.5 g/dL (ref 30.0–36.0)
MCV: 76.8 fL — ABNORMAL LOW (ref 80.0–100.0)
MCV: 74.1 fL — ABNORMAL LOW (ref 80.0–100.0)
Platelets: 173 10*3/uL (ref 150–400)
Platelets: 183 10*3/uL (ref 150–400)
RBC: 3.15 MIL/uL — ABNORMAL LOW (ref 3.87–5.11)
RBC: 3.28 MIL/uL — ABNORMAL LOW (ref 3.87–5.11)
RDW: 16 % — ABNORMAL HIGH (ref 11.5–15.5)
RDW: 16.1 % — ABNORMAL HIGH (ref 11.5–15.5)
WBC: 13 10*3/uL — ABNORMAL HIGH (ref 4.0–10.5)
WBC: 13.9 10*3/uL — ABNORMAL HIGH (ref 4.0–10.5)
nRBC: 0.8 % — ABNORMAL HIGH (ref 0.0–0.2)
nRBC: 0.6 % — ABNORMAL HIGH (ref 0.0–0.2)

## 2023-09-27 LAB — COMPREHENSIVE METABOLIC PANEL WITH GFR
ALT: 10 U/L (ref 0–44)
AST: 19 U/L (ref 15–41)
Albumin: 2.7 g/dL — ABNORMAL LOW (ref 3.5–5.0)
Alkaline Phosphatase: 91 U/L (ref 38–126)
Anion gap: 8 (ref 5–15)
BUN: 6 mg/dL (ref 6–20)
CO2: 22 mmol/L (ref 22–32)
Calcium: 9.4 mg/dL (ref 8.9–10.3)
Chloride: 106 mmol/L (ref 98–111)
Creatinine, Ser: 0.43 mg/dL — ABNORMAL LOW (ref 0.44–1.00)
GFR, Estimated: >60 mL/min (ref >60)
Glucose, Bld: 120 mg/dL — ABNORMAL HIGH (ref 70–99)
Potassium: 4 mmol/L (ref 3.5–5.1)
Sodium: 136 mmol/L (ref 135–145)
Total Bilirubin: 0.4 mg/dL (ref 0.0–1.2)
Total Protein: 6.1 g/dL — ABNORMAL LOW (ref 6.5–8.1)

## 2023-09-27 LAB — MISC LABCORP TEST (SEND OUT): Labcorp test code: 486226

## 2023-09-27 LAB — PREPARE RBC (CROSSMATCH)

## 2023-09-27 MED ORDER — SODIUM CHLORIDE 0.9% IV SOLUTION: Freq: Once | INTRAVENOUS | Status: AC

## 2023-09-27 NOTE — Progress Notes (Signed)
 Patient to L&D via wheelchair for daily NST. No new complaints. She reports no bleeding, LOF, or CTX, and notes +FM. Monitors applied and assessing.

## 2023-09-27 NOTE — Progress Notes (Signed)
 NST complete. Patient returned to room 351 on MB in stable condition via wheelchair.

## 2023-09-27 NOTE — Progress Notes (Signed)
   09/27/23 1720  Fetal Heart Rate A  Mode External  Baseline Rate (A) 140 bpm  Variability 6-25 BPM  Accelerations 15 x 15  Decelerations None  Fetal Heart Rate Fetus B  Mode External  Baseline Rate (B) 135 BPM  Variability 6-25 BPM  Accelerations 15 x 15  Decelerations None  Uterine Activity  Mode Toco  Contraction Frequency (min) one noted  Contraction Duration (sec) 90  Contraction Quality Mild  Resting Tone Palpated Relaxed  Resting Time Adequate   NST completed, pt transferred back to MB room 351.
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
## 2023-09-28 ENCOUNTER — Inpatient Hospital Stay: Admitting: Anesthesiology

## 2023-09-28 ENCOUNTER — Encounter: Payer: Self-pay | Admitting: Obstetrics and Gynecology

## 2023-09-28 ENCOUNTER — Encounter
Admission: AD | Disposition: A | Discharge status: Home / Self Care | Payer: Self-pay | Source: Ambulatory Visit | Attending: Obstetrics and Gynecology

## 2023-09-28 DIAGNOSIS — Z9889 Other specified postprocedural states: Secondary | ICD-10-CM

## 2023-09-28 LAB — BPAM RBC
Blood Product Expiration Date: 202505052359
Blood Product Expiration Date: 202505052359
Blood Product Expiration Date: 202505082359
Blood Product Expiration Date: 202505082359
Blood Product Expiration Date: 202505082359
ISSUE DATE / TIME: 202504041929
ISSUE DATE / TIME: 202504051127
ISSUE DATE / TIME: 202504061712
ISSUE DATE / TIME: 202504061712
ISSUE DATE / TIME: 202504061712
Unit Type and Rh: 6200
Unit Type and Rh: 6200
Unit Type and Rh: 6200
Unit Type and Rh: 6200
Unit Type and Rh: 6200

## 2023-09-28 LAB — TYPE AND SCREEN
ABO/RH(D): AB POS
Antibody Screen: NEGATIVE
Unit division: 0
Unit division: 0
Unit division: 0
Unit division: 0
Unit division: 0

## 2023-09-28 LAB — CBC
HCT: 24.4 % — ABNORMAL LOW (ref 36.0–46.0)
HCT: 29.1 % — ABNORMAL LOW (ref 36.0–46.0)
Hemoglobin: 8 g/dL — ABNORMAL LOW (ref 12.0–15.0)
Hemoglobin: 9.4 g/dL — ABNORMAL LOW (ref 12.0–15.0)
MCH: 24.6 pg — ABNORMAL LOW (ref 26.0–34.0)
MCH: 25 pg — ABNORMAL LOW (ref 26.0–34.0)
MCHC: 32.8 g/dL (ref 30.0–36.0)
MCHC: 32.3 g/dL (ref 30.0–36.0)
MCV: 75.1 fL — ABNORMAL LOW (ref 80.0–100.0)
MCV: 77.4 fL — ABNORMAL LOW (ref 80.0–100.0)
Platelets: 150 10*3/uL (ref 150–400)
Platelets: 167 10*3/uL (ref 150–400)
RBC: 3.25 MIL/uL — ABNORMAL LOW (ref 3.87–5.11)
RBC: 3.76 MIL/uL — ABNORMAL LOW (ref 3.87–5.11)
RDW: 16.1 % — ABNORMAL HIGH (ref 11.5–15.5)
RDW: 16.2 % — ABNORMAL HIGH (ref 11.5–15.5)
WBC: 11.4 10*3/uL — ABNORMAL HIGH (ref 4.0–10.5)
WBC: 11 10*3/uL — ABNORMAL HIGH (ref 4.0–10.5)
nRBC: 1.1 % — ABNORMAL HIGH (ref 0.0–0.2)
nRBC: 1.4 % — ABNORMAL HIGH (ref 0.0–0.2)

## 2023-09-28 LAB — PREPARE RBC (CROSSMATCH)

## 2023-09-28 SURGERY — Surgical Case
Anesthesia: Spinal | Laterality: Bilateral

## 2023-09-28 MED ORDER — CHLORHEXIDINE GLUCONATE 0.12 % MT SOLN
OROMUCOSAL | Status: AC
Start: 2023-09-28 — End: 2023-09-28
  Administered 2023-09-28: 15 mL
  Filled 2023-09-28: qty 15

## 2023-09-28 MED ORDER — DIPHENHYDRAMINE HCL 25 MG PO CAPS: 25.0000 mg | ORAL_CAPSULE | ORAL | Status: DC | PRN

## 2023-09-28 MED ORDER — GABAPENTIN 300 MG PO CAPS
300.0000 mg | ORAL_CAPSULE | Freq: Every day | ORAL | Status: DC
  Administered 2023-09-28 – 2023-09-30 (×3): 300 mg via ORAL
  Filled 2023-09-28 (×3): qty 1

## 2023-09-28 MED ORDER — MORPHINE SULFATE (PF) 0.5 MG/ML IJ SOLN
INTRAMUSCULAR | Status: AC
  Filled 2023-09-28: qty 10

## 2023-09-28 MED ORDER — BUPIVACAINE HCL (PF) 0.25 % IJ SOLN
INTRAMUSCULAR | Status: AC
  Filled 2023-09-28: qty 60

## 2023-09-28 MED ORDER — CALCIUM GLUCONATE 10 % IV SOLN
INTRAVENOUS | Status: AC
  Filled 2023-09-28: qty 10

## 2023-09-28 MED ORDER — ACETAMINOPHEN 325 MG PO TABS: 650.0000 mg | ORAL_TABLET | Freq: Four times a day (QID) | ORAL | Status: DC

## 2023-09-28 MED ORDER — OXYCODONE HCL 5 MG PO TABS
5.0000 mg | ORAL_TABLET | ORAL | Status: DC | PRN
  Administered 2023-09-28 – 2023-10-01 (×12): 10 mg via ORAL
  Administered 2023-10-01: 5 mg via ORAL
  Filled 2023-09-28 (×13): qty 2

## 2023-09-28 MED ORDER — DIPHENHYDRAMINE HCL 25 MG PO CAPS: 25.0000 mg | ORAL_CAPSULE | Freq: Four times a day (QID) | ORAL | Status: DC | PRN

## 2023-09-28 MED ORDER — PRENATAL MULTIVITAMIN CH
1.0000 | ORAL_TABLET | Freq: Every day | ORAL | Status: DC
  Administered 2023-09-29 – 2023-10-01 (×3): 1 via ORAL
  Filled 2023-09-28 (×3): qty 1

## 2023-09-28 MED ORDER — MEASLES, MUMPS & RUBELLA VAC IJ SOLR
0.5000 mL | INTRAMUSCULAR | Status: DC | PRN
Start: 2023-09-28 — End: 2023-10-01

## 2023-09-28 MED ORDER — DIPHENHYDRAMINE HCL 50 MG/ML IJ SOLN: 12.5000 mg | INTRAMUSCULAR | Status: DC | PRN

## 2023-09-28 MED ORDER — OXYTOCIN-SODIUM CHLORIDE 30-0.9 UT/500ML-% IV SOLN
INTRAVENOUS | Status: AC
Start: 2023-09-28 — End: 2023-09-28
  Filled 2023-09-28: qty 500

## 2023-09-28 MED ORDER — SIMETHICONE 80 MG PO CHEW: 80.0000 mg | CHEWABLE_TABLET | ORAL | Status: DC | PRN

## 2023-09-28 MED ORDER — NALOXONE HCL 0.4 MG/ML IJ SOLN: 0.4000 mg | INTRAMUSCULAR | Status: DC | PRN

## 2023-09-28 MED ORDER — OXYTOCIN-SODIUM CHLORIDE 30-0.9 UT/500ML-% IV SOLN
INTRAVENOUS | Status: AC
  Administered 2023-09-28: 2.5 [IU]/h via INTRAVENOUS
  Filled 2023-09-28: qty 500

## 2023-09-28 MED ORDER — IBUPROFEN 600 MG PO TABS
600.0000 mg | ORAL_TABLET | Freq: Four times a day (QID) | ORAL | Status: DC
  Administered 2023-09-30 – 2023-10-01 (×6): 600 mg via ORAL
  Filled 2023-09-28 (×7): qty 1

## 2023-09-28 MED ORDER — ONDANSETRON HCL 4 MG/2ML IJ SOLN
4.0000 mg | Freq: Three times a day (TID) | INTRAMUSCULAR | Status: DC | PRN
  Administered 2023-09-29: 4 mg via INTRAVENOUS
  Filled 2023-09-28: qty 2

## 2023-09-28 MED ORDER — SENNOSIDES-DOCUSATE SODIUM 8.6-50 MG PO TABS: 2.0000 | ORAL_TABLET | Freq: Every day | ORAL | Status: DC

## 2023-09-28 MED ORDER — OXYTOCIN-SODIUM CHLORIDE 30-0.9 UT/500ML-% IV SOLN: 2.5000 [IU]/h | INTRAVENOUS | Status: AC

## 2023-09-28 MED ORDER — KETOROLAC TROMETHAMINE 30 MG/ML IJ SOLN
30.0000 mg | Freq: Four times a day (QID) | INTRAMUSCULAR | Status: AC
  Administered 2023-09-28 – 2023-09-29 (×4): 30 mg via INTRAVENOUS
  Filled 2023-09-28 (×5): qty 1

## 2023-09-28 MED ORDER — MORPHINE SULFATE (PF) 0.5 MG/ML IJ SOLN
INTRAMUSCULAR | Status: DC | PRN
  Administered 2023-09-28: .2 mg via INTRATHECAL

## 2023-09-28 MED ORDER — MENTHOL 3 MG MT LOZG: 1.0000 | LOZENGE | OROMUCOSAL | Status: DC | PRN

## 2023-09-28 MED ORDER — LABETALOL HCL 5 MG/ML IV SOLN: 20.0000 mg | INTRAVENOUS | Status: DC | PRN

## 2023-09-28 MED ORDER — SODIUM CHLORIDE 0.9% FLUSH: 3.0000 mL | INTRAVENOUS | Status: DC | PRN

## 2023-09-28 MED ORDER — BUPIVACAINE HCL (PF) 0.25 % IJ SOLN
INTRAMUSCULAR | Status: DC | PRN
  Administered 2023-09-28: 60 mL

## 2023-09-28 MED ORDER — FENTANYL CITRATE (PF) 100 MCG/2ML IJ SOLN: 25.0000 ug | INTRAMUSCULAR | Status: DC | PRN

## 2023-09-28 MED ORDER — ACETAMINOPHEN 500 MG PO TABS
1000.0000 mg | ORAL_TABLET | Freq: Once | ORAL | Status: AC
  Filled 2023-09-28: qty 2

## 2023-09-28 MED ORDER — MORPHINE SULFATE (PF) 2 MG/ML IV SOLN
1.0000 mg | INTRAVENOUS | Status: DC | PRN
  Administered 2023-09-28 – 2023-09-29 (×2): 2 mg via INTRAVENOUS
  Filled 2023-09-28 (×2): qty 1

## 2023-09-28 MED ORDER — ACETAMINOPHEN 500 MG PO TABS
1000.0000 mg | ORAL_TABLET | Freq: Four times a day (QID) | ORAL | Status: DC
  Administered 2023-09-29 – 2023-09-30 (×5): 1000 mg via ORAL
  Filled 2023-09-28 (×5): qty 2

## 2023-09-28 MED ORDER — WITCH HAZEL-GLYCERIN EX PADS: 1.0000 | MEDICATED_PAD | CUTANEOUS | Status: DC | PRN

## 2023-09-28 MED ORDER — OXYCODONE HCL 5 MG PO TABS: 5.0000 mg | ORAL_TABLET | ORAL | Status: AC | PRN

## 2023-09-28 MED ORDER — OXYCODONE HCL 5 MG/5ML PO SOLN: 5.0000 mg | Freq: Once | ORAL | Status: DC | PRN

## 2023-09-28 MED ORDER — KETOROLAC TROMETHAMINE 30 MG/ML IJ SOLN: 30.0000 mg | Freq: Four times a day (QID) | INTRAMUSCULAR | Status: DC

## 2023-09-28 MED ORDER — TRANEXAMIC ACID-NACL 1000-0.7 MG/100ML-% IV SOLN
1000.0000 mg | INTRAVENOUS | Status: AC
  Administered 2023-09-28: 1000 mg via INTRAVENOUS
  Filled 2023-09-28: qty 100

## 2023-09-28 MED ORDER — PHENYLEPHRINE HCL-NACL 20-0.9 MG/250ML-% IV SOLN
INTRAVENOUS | Status: AC
  Filled 2023-09-28: qty 250

## 2023-09-28 MED ORDER — LIDOCAINE HCL (PF) 1 % IJ SOLN
INTRAMUSCULAR | Status: DC | PRN
  Administered 2023-09-28: 2 mL via SUBCUTANEOUS

## 2023-09-28 MED ORDER — DIBUCAINE (PERIANAL) 1 % EX OINT: 1.0000 | TOPICAL_OINTMENT | CUTANEOUS | Status: DC | PRN

## 2023-09-28 MED ORDER — BUPIVACAINE IN DEXTROSE 0.75-8.25 % IT SOLN
INTRATHECAL | Status: DC | PRN
  Administered 2023-09-28: 1.6 mL via INTRATHECAL

## 2023-09-28 MED ORDER — FENTANYL CITRATE (PF) 100 MCG/2ML IJ SOLN: INTRAMUSCULAR | Status: DC | PRN

## 2023-09-28 MED ORDER — CEFAZOLIN SODIUM-DEXTROSE 2-4 GM/100ML-% IV SOLN
2.0000 g | Freq: Once | INTRAVENOUS | Status: AC
  Administered 2023-09-28: 2 g via INTRAVENOUS
  Filled 2023-09-28: qty 100

## 2023-09-28 MED ORDER — LACTATED RINGERS IV SOLN: INTRAVENOUS | Status: DC

## 2023-09-28 MED ORDER — FENTANYL CITRATE (PF) 100 MCG/2ML IJ SOLN
INTRAMUSCULAR | Status: AC
  Filled 2023-09-28: qty 2

## 2023-09-28 MED ORDER — COCONUT OIL OIL: 1.0000 | TOPICAL_OIL | Status: DC | PRN

## 2023-09-28 MED ORDER — SOD CITRATE-CITRIC ACID 500-334 MG/5ML PO SOLN
ORAL | Status: AC
Start: 2023-09-28 — End: 2023-09-28
  Administered 2023-09-28: 30 mL
  Filled 2023-09-28: qty 15

## 2023-09-28 MED ORDER — MAGNESIUM SULFATE 40 GM/1000ML IV SOLN
2.0000 g/h | INTRAVENOUS | Status: DC
  Administered 2023-09-29: 2 g/h via INTRAVENOUS
  Filled 2023-09-28: qty 1000

## 2023-09-28 MED ORDER — OXYTOCIN-SODIUM CHLORIDE 30-0.9 UT/500ML-% IV SOLN
INTRAVENOUS | Status: DC | PRN
  Administered 2023-09-28: 300 mL via INTRAVENOUS

## 2023-09-28 MED ORDER — OXYCODONE HCL 5 MG PO TABS: 5.0000 mg | ORAL_TABLET | Freq: Once | ORAL | Status: DC | PRN

## 2023-09-28 MED ORDER — 0.9 % SODIUM CHLORIDE (POUR BTL) OPTIME
TOPICAL | Status: DC | PRN
  Administered 2023-09-28: 1000 mL

## 2023-09-28 MED ORDER — ENOXAPARIN SODIUM 40 MG/0.4ML IJ SOSY
40.0000 mg | PREFILLED_SYRINGE | INTRAMUSCULAR | Status: DC
  Filled 2023-09-28 (×2): qty 0.4

## 2023-09-28 MED ORDER — MAGNESIUM SULFATE 40 GM/1000ML IV SOLN
4.0000 g/h | Freq: Once | INTRAVENOUS | Status: AC
  Administered 2023-09-28: 4 g/h via INTRAVENOUS
  Filled 2023-09-28: qty 1000

## 2023-09-28 MED ORDER — GABAPENTIN 300 MG PO CAPS
300.0000 mg | ORAL_CAPSULE | Freq: Once | ORAL | Status: AC
  Administered 2023-09-28: 300 mg via ORAL
  Filled 2023-09-28: qty 1

## 2023-09-28 MED ORDER — HYDRALAZINE HCL 20 MG/ML IJ SOLN: 10.0000 mg | INTRAMUSCULAR | Status: DC | PRN

## 2023-09-28 MED ORDER — MAGNESIUM SULFATE 4 GM/100ML IV SOLN
4.0000 g | Freq: Once | INTRAVENOUS | Status: AC
Start: 2023-09-28 — End: 2023-09-28
  Filled 2023-09-28: qty 100

## 2023-09-28 MED ORDER — CARBOPROST TROMETHAMINE 250 MCG/ML IM SOLN
INTRAMUSCULAR | Status: AC
  Filled 2023-09-28: qty 1

## 2023-09-28 MED ORDER — SIMETHICONE 80 MG PO CHEW
80.0000 mg | CHEWABLE_TABLET | Freq: Three times a day (TID) | ORAL | Status: DC
  Administered 2023-09-29 – 2023-10-01 (×8): 80 mg via ORAL
  Filled 2023-09-28 (×8): qty 1

## 2023-09-28 MED ORDER — FENTANYL CITRATE (PF) 100 MCG/2ML IJ SOLN
INTRAMUSCULAR | Status: DC | PRN
  Administered 2023-09-28: 15 ug via INTRATHECAL

## 2023-09-28 MED ORDER — ONDANSETRON HCL 4 MG/2ML IJ SOLN
INTRAMUSCULAR | Status: DC | PRN
  Administered 2023-09-28: 4 mg via INTRAVENOUS

## 2023-09-28 MED ORDER — LACTATED RINGERS IV SOLN: INTRAVENOUS | Status: AC

## 2023-09-28 MED ORDER — NALOXONE HCL 4 MG/10ML IJ SOLN: 1.0000 ug/kg/h | INTRAVENOUS | Status: DC | PRN

## 2023-09-28 MED ORDER — SODIUM CHLORIDE 0.9% IV SOLUTION: Freq: Once | INTRAVENOUS | Status: DC

## 2023-09-28 MED ORDER — LABETALOL HCL 5 MG/ML IV SOLN
40.0000 mg | INTRAVENOUS | Status: DC | PRN
  Administered 2023-09-28: 40 mg via INTRAVENOUS

## 2023-09-28 MED ORDER — SOD CITRATE-CITRIC ACID 500-334 MG/5ML PO SOLN
ORAL | Status: AC
  Administered 2023-09-28: 30 mL
  Filled 2023-09-28: qty 15

## 2023-09-28 MED ORDER — MEPERIDINE HCL 25 MG/ML IJ SOLN: 6.2500 mg | INTRAMUSCULAR | Status: DC | PRN

## 2023-09-28 MED ORDER — CEFAZOLIN SODIUM-DEXTROSE 2-4 GM/100ML-% IV SOLN
INTRAVENOUS | Status: AC
  Filled 2023-09-28: qty 100

## 2023-09-28 MED ORDER — LABETALOL HCL 5 MG/ML IV SOLN: 80.0000 mg | INTRAVENOUS | Status: DC | PRN

## 2023-09-28 MED ORDER — MAGNESIUM SULFATE BOLUS VIA INFUSION
2.0000 g/h | Freq: Once | INTRAVENOUS | Status: AC
  Administered 2023-09-28: 2 g/h via INTRAVENOUS
  Filled 2023-09-28: qty 1000

## 2023-09-28 SURGICAL SUPPLY — 31 items
BARRIER ADHS 3X4 INTERCEED (GAUZE/BANDAGES/DRESSINGS) ×4 IMPLANT
CHLORAPREP W/TINT 26 (MISCELLANEOUS) ×4 IMPLANT
DRESSING TELFA 8X10 (GAUZE/BANDAGES/DRESSINGS) ×1 IMPLANT
DRSG TELFA 3X8 NADH STRL (GAUZE/BANDAGES/DRESSINGS) ×4 IMPLANT
ELECT CAUTERY BLADE 6.4 (BLADE) ×4 IMPLANT
ELECT REM PT RETURN 9FT ADLT (ELECTROSURGICAL) ×4 IMPLANT
ELECTRODE REM PT RTRN 9FT ADLT (ELECTROSURGICAL) ×4 IMPLANT
GAUZE SPONGE 4X4 12PLY STRL (GAUZE/BANDAGES/DRESSINGS) ×4 IMPLANT
GAUZE SPONGE 4X4 8PLY STRL (GAUZE/BANDAGES/DRESSINGS) ×1 IMPLANT
GLOVE SURG SYN 8.0 (GLOVE) ×4 IMPLANT
GLOVE SURG SYN 8.0 PF PI (GLOVE) ×2 IMPLANT
GOWN STRL REUS W/ TWL LRG LVL3 (GOWN DISPOSABLE) ×8 IMPLANT
GOWN STRL REUS W/ TWL XL LVL3 (GOWN DISPOSABLE) ×4 IMPLANT
MANIFOLD NEPTUNE II (INSTRUMENTS) ×4 IMPLANT
MAT PREVALON FULL STRYKER (MISCELLANEOUS) ×4 IMPLANT
NDL HYPO 22X1.5 SAFETY MO (MISCELLANEOUS) ×2 IMPLANT
NEEDLE HYPO 22X1.5 SAFETY MO (MISCELLANEOUS) ×4 IMPLANT
NS IRRIG 1000ML POUR BTL (IV SOLUTION) ×4 IMPLANT
PACK C SECTION AR (MISCELLANEOUS) ×4 IMPLANT
PAD OB MATERNITY 11 LF (PERSONAL CARE ITEMS) ×4 IMPLANT
PAD PREP OB/GYN DISP 24X41 (PERSONAL CARE ITEMS) ×4 IMPLANT
SCRUB CHG 4% DYNA-HEX 4OZ (MISCELLANEOUS) ×4 IMPLANT
STRAP SAFETY 5IN WIDE (MISCELLANEOUS) ×4 IMPLANT
SUT CHROMIC 1 CTX 36 (SUTURE) ×12 IMPLANT
SUT PLAIN GUT 0 (SUTURE) ×9 IMPLANT
SUT VIC AB 0 CT1 36 (SUTURE) ×8 IMPLANT
SUT VICRYL 0 TIES 12 18 (SUTURE) ×1 IMPLANT
SUT VICRYL 3-0 CR8 SH (SUTURE) ×1 IMPLANT
SYR 30ML LL (SYRINGE) ×8 IMPLANT
TRAP FLUID SMOKE EVACUATOR (MISCELLANEOUS) ×4 IMPLANT
WATER STERILE IRR 500ML POUR (IV SOLUTION) ×4 IMPLANT

## 2023-09-28 NOTE — TOC Progression Note (Signed)
 Transition of Care [facility]) - Progression Note    Patient Details  Name: [name] MRN: [id number] Date of Birth: 1996/02/26  [facility] CM/SW Contact  Susa Simmonds, [place] Phone Number: 09/28/2023, 10:26 AM  Clinical Narrative:   CSW spoke with MOB who stated she had questions about housing. MOB states she is currently living with a friend and its not working. MOB stated her plan is to ger her own apartment. MOB stated she filled out an application for [place] housing. MOB  stated she will discharge to her grandmother's home after the twins are born. MOB stated her grandmother is allowing her to get back on her feet and get her own apartment. CSW asked MOB if the father of the twins is able to provide assistance. MOB stated no, not at this time. MOB also asked questions about a food stamp application. CSW encouraged MOB to contact [facility] DSS tomorrow to see if they can send an application through email or find out if there is a portal online. MOB will be staying in the hospital until delivery and will not be able to go to DSS in person to fill out any applications. MOB stated she would give DSS a call.          Expected Discharge Plan and Services                                               Social Determinants of Health (SDOH) Interventions SDOH Screenings   Food Insecurity: No Food Insecurity (09/27/2023)  Housing: Low Risk  (09/27/2023)  Transportation Needs: No Transportation Needs (09/27/2023)  Utilities: Not At Risk (09/27/2023)  Financial Resource Strain: Low Risk  (07/04/2020)   Received from [facility], [facility]  Physical Activity: Inactive (07/04/2020)   Received from [facility], [facility]  Social Connections: Socially Isolated (07/04/2020)   Received from [facility], [facility]  Stress: No Stress Concern Present  (07/04/2020)   Received from [facility], [facility]  Tobacco Use: Low Risk  (09/25/2023)    Readmission Risk Interventions     No data to display
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
## 2023-09-28 NOTE — Progress Notes (Signed)
 Patient ID: [name], female   DOB: 1996-03-03, 28 y.o.   MRN: [id number] 34+[redacted] weeks EGA  Pt with BP 150-160's . Several BP making preeclampsia with severe criteria . No cns symptoms  Anemia hgb 8.0 after 2 units of blood .  U/S repeat Vtx / Br with AFI  MVP B= 1.4 cm Current FHM  a+b reassuring  A: preeclampsia with severe features , now at 34+1 weeks  P: spoke to the pt about role for repeat c/s in a controled setting . She has agreed and she also desires elective sterilization  I will start Magnesium 4 gm bolus and 2 gm / hr I will have blood in OR ready for transfusion , Start TXA 1 gm prior to commencement  Hemabate in OR  Neonatology to speak to pt before delivery  Anesthesiology is aware  The risks of cesarean section discussed with the patient included but were not limited to: bleeding which may require transfusion or reoperation; infection which may require antibiotics; injury to bowel, bladder, ureters or other surrounding organs; injury to the fetus; need for additional procedures including hysterectomy in the event of a life-threatening hemorrhage; placental abnormalities wth subsequent pregnancies, incisional problems, thromboembolic phenomenon and other postoperative/anesthesia complications. The patient concurred with the proposed plan, giving informed written consent for the procedure.   Patient has been NPO since 0900 she will remain NPO for procedure. Anesthesia and OR aware. Preoperative prophylactic antibiotics and SCDs ordered on call to the OR.  To OR when ready.

## 2023-09-28 NOTE — Lactation Note (Signed)
 This note was copied from a baby's chart. Lactation Consultation Note  Patient Name: Dalylah Ramey [id number] Date: 09/28/2023 Age:28 hours Reason for consult: L&D Initial assessment   Maternal Data Does the patient have breastfeeding experience prior to this delivery?: Yes How long did the patient breastfeed?: States she pumped with first child  Feeding Mother's Current Feeding Choice: Breast Milk  LATCH Score                    Lactation Tools Discussed/Used    Interventions Interventions: DEBP (set up)  IBCLC visited patient room at 1930 and 2100. Pain has been difficulty to manage and mom asked to delay DEBP initiation until her pain is under control. Pump set up with colostrum collection attachments to flanges, syringes, colostrum bottles, and cleaning supplies along with pump motor. Mom encouraged to ideally begin pumping within 6 hours of the cesarean section for newborn twins in the SCN.   Discharge    Consult Status Consult Status: Follow-up from L&D    Matt Holmes 09/28/2023, 9:37 PM

## 2023-09-28 NOTE — Anesthesia Procedure Notes (Signed)
 Spinal  Patient location during procedure: OR Start time: 09/28/2023 5:27 PM End time: 09/28/2023 5:28 PM Reason for block: surgical anesthesia Staffing Performed: resident/CRNA  Anesthesiologist: Piscitello, Cleda Mccreedy, MD Resident/CRNA: Irving Burton, CRNA Performed by: Irving Burton, CRNA Authorized by: Rosaria Ferries, MD   Preanesthetic Checklist Completed: patient identified, IV checked, site marked, risks and benefits discussed, surgical consent, monitors and equipment checked, pre-op evaluation and timeout performed Spinal Block Patient position: sitting Prep: ChloraPrep Patient monitoring: heart rate, continuous pulse ox, blood pressure and cardiac monitor Approach: midline Location: L3-4 Injection technique: single-shot Needle Needle type: Introducer and Pencan  Needle gauge: 24 G Needle length: 9 cm Assessment Sensory level: T10 Events: CSF return Additional Notes Sterile aseptic technique used throughout the procedure.  Negative paresthesia. Negative blood return. Positive free-flowing CSF. Expiration date of kit checked and confirmed. Patient tolerated procedure well, without complications.
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
## 2023-09-28 NOTE — Transfer of Care (Signed)
 Immediate Anesthesia Transfer of Care Note  Patient: [name]  Procedure(s) Performed: Cesarean Section With Bilateral Tubal Ligation (Bilateral)  Patient Location:  LDR6  Anesthesia Type:General  Level of Consciousness: awake, alert , and oriented  Airway & Oxygen Therapy: Patient Spontanous Breathing  Post-op Assessment: Report given to RN and Post -op Vital signs reviewed and stable  Post vital signs: Reviewed and stable  Last Vitals:  Vitals Value Taken Time  BP    Temp    Pulse    Resp    SpO2      Last Pain:  Vitals:   09/28/23 1207  TempSrc: Oral  PainSc:          Complications: No notable events documented.

## 2023-09-28 NOTE — Lactation Note (Signed)
 This note was copied from a baby's chart. Lactation Consultation Note  Patient Name: [name] Today's Date: 09/28/2023 Age:28 hours Reason for consult: L&D Initial assessment   Maternal Data Does the patient have breastfeeding experience prior to this delivery?: Yes  Feeding Mother's Current Feeding Choice: Breast Milk  LATCH Score                    Lactation Tools Discussed/Used    Interventions Interventions: DEBP; IBCLC visited patient room at 1930 and 2100. Pain has been difficulty to manage and mom asked to delay DEBP initiation until her pain is under control. Pump set up with colostrum collection attachments to flanges, syringes, colostrum bottles, and cleaning supplies along with pump motor. Mom encouraged to ideally begin pumping within 6 hours of the cesarean section for newborn twins in the SCN.   Discharge    Consult Status      Matt Holmes 09/28/2023, 9:28 PM
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
## 2023-09-28 NOTE — Brief Op Note (Signed)
 09/25/2023 - 09/28/2023  6:32 PM  PATIENT:  [name]  28 y.o. 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  POST-OPERATIVE DIAGNOSIS: same as above   Vigorous females A+B  PROCEDURE:  Procedure(s): Cesarean Section With Bilateral Tubal Ligation (Bilateral) Repeat low transverse cesarean section   Cicatrix removal  Bilateral tubal ligation - [place]   SURGEON:  Surgeons and Role:    * Kaislyn Gulas, Ihor Austin, MD - Primary  PHYSICIAN ASSISTANT: JAzmine Liboon , CNM   ASSISTANTS: cst   ANESTHESIA:   spinal  EBL: qbl : 595, iof 800 , ou 100   BLOOD ADMINISTERED:none  DRAINS: Urinary Catheter (Foley)   LOCAL MEDICATIONS USED:  MARCAINE     SPECIMEN:  Source of Specimen:  placenta , portion bilateral tubes   DISPOSITION OF SPECIMEN:  PATHOLOGY  COUNTS:  YES  TOURNIQUET:  * No tourniquets in log *  DICTATION: .Other Dictation: Dictation Number verbal  PLAN OF CARE: Admit to inpatient   PATIENT DISPOSITION:  PACU - hemodynamically stable.   Delay start of Pharmacological VTE agent (>24hrs) due to surgical blood loss or risk of bleeding: not applicable

## 2023-09-28 NOTE — Progress Notes (Signed)
 Mag switched over to 2g/hr as per verbal order. Verified with Lysbeth Galas.

## 2023-09-29 ENCOUNTER — Encounter: Payer: Self-pay | Admitting: Obstetrics and Gynecology

## 2023-09-29 LAB — CBC
HCT: 26.2 % — ABNORMAL LOW (ref 36.0–46.0)
Hemoglobin: 8.6 g/dL — ABNORMAL LOW (ref 12.0–15.0)
MCH: 25.5 pg — ABNORMAL LOW (ref 26.0–34.0)
MCHC: 32.8 g/dL (ref 30.0–36.0)
MCV: 77.7 fL — ABNORMAL LOW (ref 80.0–100.0)
Platelets: 144 10*3/uL — ABNORMAL LOW (ref 150–400)
RBC: 3.37 MIL/uL — ABNORMAL LOW (ref 3.87–5.11)
RDW: 16.1 % — ABNORMAL HIGH (ref 11.5–15.5)
WBC: 13 10*3/uL — ABNORMAL HIGH (ref 4.0–10.5)
nRBC: 0.9 % — ABNORMAL HIGH (ref 0.0–0.2)

## 2023-09-29 LAB — COMPREHENSIVE METABOLIC PANEL WITH GFR
ALT: 13 U/L (ref 0–44)
AST: 25 U/L (ref 15–41)
Albumin: 2.6 g/dL — ABNORMAL LOW (ref 3.5–5.0)
Alkaline Phosphatase: 76 U/L (ref 38–126)
Anion gap: 10 (ref 5–15)
BUN: <5 mg/dL — ABNORMAL LOW (ref 6–20)
CO2: 24 mmol/L (ref 22–32)
Calcium: 7.6 mg/dL — ABNORMAL LOW (ref 8.9–10.3)
Chloride: 104 mmol/L (ref 98–111)
Creatinine, Ser: 0.58 mg/dL (ref 0.44–1.00)
GFR, Estimated: >60 mL/min (ref >60)
Glucose, Bld: 87 mg/dL (ref 70–99)
Potassium: 3.7 mmol/L (ref 3.5–5.1)
Sodium: 138 mmol/L (ref 135–145)
Total Bilirubin: 0.4 mg/dL (ref 0.0–1.2)
Total Protein: 5.8 g/dL — ABNORMAL LOW (ref 6.5–8.1)

## 2023-09-29 MED ORDER — FERROUS SULFATE 325 (65 FE) MG PO TABS
325.0000 mg | ORAL_TABLET | ORAL | Status: DC
  Administered 2023-09-29: 325 mg via ORAL
  Filled 2023-09-29: qty 1

## 2023-09-29 MED ORDER — SENNA 8.6 MG PO TABS
2.0000 | ORAL_TABLET | Freq: Every day | ORAL | Status: DC
  Administered 2023-09-30 – 2023-10-01 (×2): 17.2 mg via ORAL
  Filled 2023-09-29 (×3): qty 2

## 2023-09-29 MED ORDER — LABETALOL HCL 200 MG PO TABS
200.0000 mg | ORAL_TABLET | Freq: Two times a day (BID) | ORAL | Status: DC
  Administered 2023-09-29 – 2023-10-01 (×5): 200 mg via ORAL
  Filled 2023-09-29 (×4): qty 1
  Filled 2023-09-29: qty 2

## 2023-09-29 MED ORDER — POLYETHYLENE GLYCOL 3350 17 G PO PACK
17.0000 g | PACK | Freq: Every day | ORAL | Status: DC
  Filled 2023-09-29 (×2): qty 1

## 2023-09-29 NOTE — Progress Notes (Signed)
 OB MAG NOTE:   S: Doing well. Denies Denies headache, vision changes, RUQ pain, CP, and SOB. Saw babies overnight and was able to sleep after that.     O: Vitals:   09/29/23 0539 09/29/23 0622 09/29/23 0725 09/29/23 0825  BP: 132/73 134/73 138/84 (!) 160/84  Pulse: 79 82 83 82  Resp: 17 16 14 16   Temp:   98 F (36.7 C)   TempSrc:   Oral   SpO2:      Weight:      Height:       Total I/O In: 804.8 [P.O.:240; I.V.:564.8] Out: 345 [Urine:345]   Gen: Alert, resting comfortably CV: RRR Resp: CTAB, breathing non-labored on room air. Abd: appropriately tender, non-distended, no rebound Incision: c/d/I with pressure dressing in place Neuro: 2+ reflexes, 2 beats clonus bilaterally Edema: Trace BLE edema     Labs: Recent Labs    09/28/23 2018 09/29/23 0553  WBC 11.0* 13.0*  HGB 9.4* 8.6*  HCT 29.1* 26.2*  MCV 77.4* 77.7*  PLT 167 144*   Recent Labs    09/27/23 0558  NA 136  K 4.0  CL 106  CO2 22  CREATININE 0.43*  BUN 6  CALCIUM 9.4  ALT 10  AST 19   No results found for: "MG"   A/P: [name]  is a 28 y.o. [id number] POD1 s/p RLTCS at @[redacted]w[redacted]d  due to preeclampsia with severe features.   #PreE w/ SF - CLD while on magnesium - On IV magnesium for seizure prophylaxis - No signs/sx of mag toxicity: continue through 24 hr postpartum - Foley in place. Strict Is & Os. UOP adequate - BPs: normal to severe- last severe this AM at 0825 - No si/sx of preE other than clonus bilaterally - Labs: CMP ordered  #Post op - Anemia as below - CLD while on mag - Pain controlled- scheduled tylenol and ibuprofen and PRN oxy - Foley in place  #Anemia, clinically stable - Hb: 7.8 (4/3) > 7.7 > 1u > 7.8 (4/5) >> 1u > 8.0 (4/6) > c/s > 9.4 > 8.6 this AM - PO ferrous sulfate MWF  #PPX: Sq Lovenox, SCDs   Electronically signed by:  Romana Juniper, MD, 09/29/2023, 8:27 AM

## 2023-09-29 NOTE — Op Note (Signed)
 [name], [name] MEDICAL RECORD NO: '[id number] ACCOUNT NO: [id number] DATE OF BIRTH: 07/03/1995 FACILITY: [facility] LOCATION: [facility] PHYSICIAN: Suzy Bouchard, MD  Operative Report   DATE OF PROCEDURE: 09/28/2023  PREOPERATIVE DIAGNOSES: 1.  34+1 weeks estimated gestational age. 2.  Preeclampsia with severe features. 3.  Previous cesarean section. 4.  Intrauterine growth restriction, baby B. 5.  Di-di twin gestation. 6.  Vertex breech presentation. 7.  Keloid of previous Pfannenstiel incision scar. 8.  Elective permanent sterilization.  POSTOPERATIVE DIAGNOSES: 1.  34+1 weeks estimated gestational age. 2.  Preeclampsia with severe features. 3.  Previous cesarean section. 4.  Intrauterine growth restriction, baby B. 5.  Di-di twin gestation. 6.  Vertex breech presentation. 7.  Keloid of previous Pfannenstiel incision scar. 8.  Elective permanent sterilization. 9.  Vigorous females, twin A and B delivered.  PROCEDURE: 1.  Repeat low transverse cesarean section. 2.  Cicatrix removal. 3.  Bilateral tubal ligation, [place].  SURGEON:  Suzy Bouchard, MD  FIRST ASSISTANT:  Josephina Shih, certified nurse midwife.  ANESTHESIA:  Spinal.  INDICATIONS:  A 28 year old gravida 3, para 1 patient with a di-di twin gestation admitted 3 days previously with preeclampsia without severe features.  The patient was evaluated by maternal fetal medicine, who recommended proceeding with delivery at 35  weeks if the patient did not develop severe features of preeclampsia.  The patient did receive two doses of steroids.  Baby B was noted to be IUGR, growth 7 percentile.  On the day of the procedure, the patient's blood pressure was elevated, meeting  severe criteria, and the patient elected for a repeat cesarean section as well as permanent sterilization.  DESCRIPTION OF PROCEDURE:  After adequate spinal anesthesia, the patient was placed in the dorsal supine position,  hip rolled on to the right side.  Vaginal and abdominal prep was performed without difficulty.  Timeout was performed.  The patient did  receive TXA 1 g during the procedure given her history of anemia and at risk for intraoperative bleeding.  One unit of blood was available during the procedure.  Timeout was performed.  Cicatrix was removed after placing the Traxi pannus elevator.  Sharp  dissection was used to identify the fascia.  The fascia was opened in the midline and opened in a transverse fashion.  The superior aspect of the fascia was grasped with Kocher clamps, and the recti muscles were dissected free.  The inferior aspect of  the fascia was grasped with Kocher clamps, and the pyramidalis muscles were dissected free.  Entry into the peritoneal cavity was accomplished sharply.  The vesicouterine peritoneal fold was identified.  A direct low uterine transverse incision was made  upon entry into baby A's sac.  Clear fluid resulted.  The fetal head was then delivered through the incision, and a vigorous female was dried on the mother's abdomen for 60 seconds, and the cord was doubly clamped, and the vigorous female was passed to  the neonatologist, who assigned Apgar scores of 8 and 9, fetal weight 2040 g.  The baby B amniotic sac was ruptured, and baby, which was demonstrated to be breech 1 hour before the procedure, was now vertex, and the head was delivered followed by  shoulders and body without difficulty.  A small female was vigorous, dried on the mother's abdomen for 60 seconds, and the cord was doubly clamped, and the infant was passed to nursery staff, who assigned Apgar scores of 7 and 9, fetal weight 4 pounds an  1 ounce.  The placenta was then manually removed and will be sent to pathology with a single clamp on umbilical cord A and a double clamp on umbilical cord B.  The uterus was exteriorized, and the endometrial cavity was wiped clean with laparotomy tape.   The uterine incision was  then closed with 1-0 chromic suture in a running locking fashion.  Good approximation of edges.  Several additional figure-of-eight sutures were used for good hemostasis.  The fallopian tubes were then identified.  Attention  was directed to the patient's right fallopian tube, which was grasped at the mid-portion, and two separate 0 plain gut sutures placed on the right fallopian tube, and a 1.5 cm portion of the fallopian tube was removed.  Good hemostasis was noted.  A  similar procedure was repeated on the patient's left fallopian tube.  Again, after placing 2-0 plain gut sutures on the mid-portion of the fallopian tube, a 1.5 cm portion of the fallopian tube was removed.  Good hemostasis was noted.  The posterior  cul-de-sac was irrigated and suctioned, and the uterus was placed back into the abdominal cavity.  The paracolic gutters were wiped clean with laparotomy tape, and the tubal ligation sites were visualized and appeared hemostatic.  The uterine incision  appeared hemostatic, and Interceed was placed over the uterine incision in a T-shaped fashion.  The fascia was then closed with 0 Vicryl suture in a running non-locking fashion.  Two separate sutures utilized.  The fascial edges were injected with a  solution of 60 mL of 0.25% Marcaine plus 20 mL of normal saline.  30 mL of solution was used.  The subcutaneous tissues were irrigated and Bovie for hemostasis, and the skin was then reapproximated with Insorb absorbable staples.  An additional 20 mL of  Marcaine solution was injected beneath the skin.  Pressure dressing applied.  QUANTITATIVE BLOOD LOSS:  595 mL.  INTRAOPERATIVE FLUIDS:  800 mL.  URINE OUTPUT:  100 mL.  DISPOSITION:  The patient remained hemodynamically stable throughout the procedure and was taken to the recovery room in good condition.   PUS D: 09/28/2023 7:31:40 pm T: 09/29/2023 2:04:00 am  JOB: [id number]/ [id number]

## 2023-09-29 NOTE — Plan of Care (Signed)
  Problem: Clinical Measurements: Goal: Complications related to the disease process, condition or treatment will be avoided or minimized Outcome: Progressing   Problem: Education: Goal: Knowledge of disease or condition will improve Outcome: Progressing Goal: Knowledge of the prescribed therapeutic regimen will improve Outcome: Progressing   Problem: Fluid Volume: Goal: Peripheral tissue perfusion will improve Outcome: Progressing   Problem: Clinical Measurements: Goal: Complications related to disease process, condition or treatment will be avoided or minimized Outcome: Progressing   Problem: Education: Goal: Knowledge of General Education information will improve Description: Including pain rating scale, medication(s)/side effects and non-pharmacologic comfort measures Outcome: Progressing   Problem: Health Behavior/Discharge Planning: Goal: Ability to manage health-related needs will improve Outcome: Progressing   Problem: Clinical Measurements: Goal: Ability to maintain clinical measurements within normal limits will improve Outcome: Progressing Goal: Will remain free from infection Outcome: Progressing Goal: Diagnostic test results will improve Outcome: Progressing Goal: Respiratory complications will improve Outcome: Progressing Goal: Cardiovascular complication will be avoided Outcome: Progressing   Problem: Activity: Goal: Risk for activity intolerance will decrease Outcome: Progressing   Problem: Nutrition: Goal: Adequate nutrition will be maintained Outcome: Progressing   Problem: Coping: Goal: Level of anxiety will decrease Outcome: Progressing   Problem: Elimination: Goal: Will not experience complications related to bowel motility Outcome: Progressing Goal: Will not experience complications related to urinary retention Outcome: Progressing   Problem: Pain Managment: Goal: General experience of comfort will improve and/or be controlled Outcome:  Progressing   Problem: Safety: Goal: Ability to remain free from injury will improve Outcome: Progressing   Problem: Skin Integrity: Goal: Risk for impaired skin integrity will decrease Outcome: Progressing   Problem: Education: Goal: Knowledge of condition will improve Outcome: Progressing Goal: Individualized Educational Video(s) Outcome: Progressing Goal: Individualized Newborn Educational Video(s) Outcome: Progressing   Problem: Activity: Goal: Will verbalize the importance of balancing activity with adequate rest periods Outcome: Progressing Goal: Ability to tolerate increased activity will improve Outcome: Progressing   Problem: Coping: Goal: Ability to identify and utilize available resources and services will improve Outcome: Progressing   Problem: Life Cycle: Goal: Chance of risk for complications during the postpartum period will decrease Outcome: Progressing   Problem: Role Relationship: Goal: Ability to demonstrate positive interaction with newborn will improve Outcome: Progressing   Problem: Skin Integrity: Goal: Demonstration of wound healing without infection will improve Outcome: Progressing   Problem: Education: Goal: Knowledge of disease or condition will improve Outcome: Completed/Met Goal: Knowledge of the prescribed therapeutic regimen will improve Outcome: Completed/Met Goal: Individualized Educational Video(s) Outcome: Completed/Met   Problem: Education: Goal: Knowledge of the prescribed therapeutic regimen will improve Outcome: Completed/Met Goal: Understanding of sexual limitations or changes related to disease process or condition will improve Outcome: Completed/Met Goal: Individualized Educational Video(s) Outcome: Completed/Met   Problem: Self-Concept: Goal: Communication of feelings regarding changes in body function or appearance will improve Outcome: Completed/Met   Problem: Skin Integrity: Goal: Demonstration of wound  healing without infection will improve Outcome: Completed/Met

## 2023-09-29 NOTE — Lactation Note (Signed)
 This note was copied from a baby's chart. Lactation Consultation Note  Patient Name: Girl Essica Kiker [id number] Date: 09/29/2023 Age:28 hours Reason for consult: Initial assessment   Maternal Data Has patient been taught Hand Expression?: Yes  Lactation to L&D to initiate pumping for twins in NICU.  Feeding Mothers Current Feeding Choice: Breast Milk and Formula   DEBP already present in room when I [facility] Student) entered. I educated the Patient on pump parts and setup. I helped Pt. hand express her Rt breast prior to pumping. Visible drops of colostrum were expressed. Breasts were then pumped individually for approx. each. A total of 10mL was collected and taken to NICU. Pt stated that her goal is to BF for 3 months, both latching and pumping. Pt has prior BF experience, but stopped due to perceived low milk supply.    Lactation Tools Discussed/Used Tools: Pump Breast pump type: Double-Electric Breast Pump Pump Education: Setup, frequency, and cleaning;Milk Storage Reason for Pumping: Infants in NICU Pumping frequency: q3 Pumped volume: 10 mL  Interventions Interventions: Breast feeding basics reviewed;Hand express  Consult Status Consult Status: Follow-up Follow-up type: In-patient    Lavone Barrientes 09/29/2023, 10:56 AM

## 2023-09-29 NOTE — Anesthesia Post-op Follow-up Note (Signed)
  Anesthesia Pain Follow-up Note  Patient: [name]  Day #: 1  Date of Follow-up: 09/29/2023 Time: 8:01 AM  Last Vitals:  Vitals:   09/29/23 0622 09/29/23 0725  BP: 134/73 138/84  Pulse: 82 83  Resp: 16 14  Temp:  36.7 C  SpO2:      Level of Consciousness: alert  Pain: none   Side Effects:None  Catheter Site Exam:clean, dry, no drainage  Anti-Coag Meds (From admission, onward)    Start     Dose/Rate Route Frequency Ordered Stop   09/29/23 1800  enoxaparin (LOVENOX) injection 40 mg        40 mg Subcutaneous Every 24 hours 09/28/23 2022          Plan: D/C from anesthesia care at surgeon's request  Payson Crumby Lawerance Cruel

## 2023-09-29 NOTE — Anesthesia Postprocedure Evaluation (Signed)
 Anesthesia Post Note  Patient: [name]  Procedure(s) Performed: Cesarean Section With Bilateral Tubal Ligation (Bilateral)  Patient location during evaluation: Mother Baby Anesthesia Type: Spinal Level of consciousness: oriented and awake and alert Pain management: pain level controlled Vital Signs Assessment: post-procedure vital signs reviewed and stable Respiratory status: spontaneous breathing and respiratory function stable Cardiovascular status: blood pressure returned to baseline and stable Postop Assessment: no headache, no backache, no apparent nausea or vomiting and able to ambulate Anesthetic complications: no   No notable events documented.   Last Vitals:  Vitals:   09/29/23 0622 09/29/23 0725  BP: 134/73 138/84  Pulse: 82 83  Resp: 16 14  Temp:  36.7 C  SpO2:      Last Pain:  Vitals:   09/29/23 0731  TempSrc:   PainSc: 3                  Shan Padgett Lawerance Cruel

## 2023-09-30 LAB — CBC
HCT: 27 % — ABNORMAL LOW (ref 36.0–46.0)
Hemoglobin: 8.8 g/dL — ABNORMAL LOW (ref 12.0–15.0)
MCH: 25.1 pg — ABNORMAL LOW (ref 26.0–34.0)
MCHC: 32.6 g/dL (ref 30.0–36.0)
MCV: 77.1 fL — ABNORMAL LOW (ref 80.0–100.0)
Platelets: 180 10*3/uL (ref 150–400)
RBC: 3.5 MIL/uL — ABNORMAL LOW (ref 3.87–5.11)
RDW: 17.3 % — ABNORMAL HIGH (ref 11.5–15.5)
WBC: 14.2 10*3/uL — ABNORMAL HIGH (ref 4.0–10.5)
nRBC: 0.6 % — ABNORMAL HIGH (ref 0.0–0.2)

## 2023-09-30 MED ORDER — ACETAMINOPHEN 500 MG PO TABS
1000.0000 mg | ORAL_TABLET | Freq: Four times a day (QID) | ORAL | Status: DC
  Administered 2023-09-30 – 2023-10-01 (×6): 1000 mg via ORAL
  Filled 2023-09-30 (×6): qty 2

## 2023-09-30 MED ORDER — NIFEDIPINE ER OSMOTIC RELEASE 30 MG PO TB24
30.0000 mg | ORAL_TABLET | Freq: Every day | ORAL | Status: DC
  Administered 2023-09-30 – 2023-10-01 (×2): 30 mg via ORAL
  Filled 2023-09-30 (×2): qty 1

## 2023-09-30 MED ORDER — FERROUS SULFATE 325 (65 FE) MG PO TABS
325.0000 mg | ORAL_TABLET | Freq: Two times a day (BID) | ORAL | Status: DC
  Administered 2023-09-30 – 2023-10-01 (×2): 325 mg via ORAL
  Filled 2023-09-30 (×2): qty 1

## 2023-09-30 NOTE — Lactation Note (Signed)
 This note was copied from a baby's chart. Lactation Consultation Note  Patient Name: [name] [id number] Date: 09/30/2023 Age:28 hours Reason for consult: Follow-up assessment;Mother's request;Late-preterm 34-36.6wks;Infant < 5lbs   Maternal Data Patient requested the presence of LC to ask question.  She stated shortly after last visit she started pumping w/ the Medela Manual pump and was able to get out some milk.  She wanted to know how long to pump with the manual pump.   Feeding Mother's Current Feeding Choice: Breast Milk and Formula  Interventions Interventions: Education  LC discussed pumping for about 10-15 minutes for each breast or she can maximize this by using the DEBP.  Patient prefers the manual at the time.   LC informed patient that she spoke to SLP and that babies are cuing and if she would like to put infants to the breast, that she can do some lick-n-learns.  Patient stated that she would like that.  Her goal is to try to go to bedside around 1730.  Education was provided on what lick-n-learns are and how the goal is to make a positive breastfeeding experience.  Discharge Pump: Personal;[name] WIC Program: Yes  Consult Status Consult Status: Follow-up Follow-up type: In-patient    Yvette Rack Cyril Railey 09/30/2023, 12:16 PM

## 2023-09-30 NOTE — Lactation Note (Signed)
 This note was copied from a baby's chart. Lactation Consultation Note  Patient Name: [name] [id number] Date: 09/30/2023 Age:28 hours Reason for consult: Follow-up assessment;Late-preterm 34-36.6wks;Infant < 5lbs   Maternal Data Lactation to room for a follow up assessment w/ patient of preterm twins.  Patient stated that she only pumped a couple of times yesterday and hasn't pumped at all today.  She has been in a lot of pain and just hasn't had the opportunity.    Feeding Mother's Current Feeding Choice: Breast Milk and Formula  Lactation Tools Discussed/Used Tools: 52F feeding tube / Syringe  Interventions Interventions: Breast feeding basics reviewed;Education  LC reviewed importance of pumping at least 8x w/in a 24hr period.  Patient verbalized understanding.  LC set up the DEBP for patient to make it easier for her to grab once she is ready to pump.  Patient did inquire about the Medela Manual pump.  LC provided education on how to use, and set up.  Patient stated that she wanted to try working with that pump, and pump was placed beside her.   Encouraged patient to call out for lactation if she has any questions or concerns.   Discharge Pump: Personal;[employer] Lauren Shamoon [employer] WIC Program: Yes  Consult Status Consult Status: Follow-up Follow-up type: In-patient    Yvette Rack [name] 09/30/2023, 9:18 AM

## 2023-09-30 NOTE — Progress Notes (Signed)
 Postop Day  2  Subjective: 28 y.o. [id number] postpartum day # 2 status post primary cesarean section. She is ambulating, is tolerating po, is voiding spontaneously.  Her pain is well controlled on PO pain medications. Her lochia is less than menses.  Objective: BP (!) 140/83 (BP Location: Left Arm)   Pulse 77   Temp 98.6 F (37 C) (Oral)   Resp 20   Ht 5\' 6"  (1.676 m)   Wt 92.5 kg   LMP 02/01/2023   SpO2 99%   Breastfeeding Unknown   BMI 32.93 kg/m    Physical Exam:  General: alert, cooperative, and appears stated age Breasts: soft/nontender Pulm: nl effort Abdomen: soft, non-tender, active bowel sounds Uterine Fundus: firm Incision: healing well, no significant drainage, no dehiscence, no significant erythema Perineum: minimal edema, intact Lochia: appropriate DVT Evaluation: No evidence of DVT seen on physical exam. Negative Homan's sign. No cords or calf tenderness. No significant calf/ankle edema.  Recent Labs    09/29/23 0553 09/30/23 0421  HGB 8.6* 8.8*  HCT 26.2* 27.0*  WBC 13.0* 14.2*  PLT 144* 180    Assessment/Plan: 28 y.o. [id number] Postop Day  2  1. Continue routine postpartum care  2. Infant feeding status: expressed breast milk --Lactation consult PRN for breastfeeding   3. Contraception plan: bilateral tubal ligation  4. Acute blood loss anemia - clinically significant.  --Hemodynamically stable and asymptomatic --Intervention: start on oral supplementation with ferrous sulfate 325 mg   5. Immunization status:   all immunizations up to date    Disposition: continue inpatient postpartum care , plan for discharge home tomorrow    LOS: 5 days   Chari Manning, CNM 09/30/2023, 10:10 AM   ----- Chari Manning Certified Nurse Midwife [facility]

## 2023-10-01 ENCOUNTER — Other Ambulatory Visit: Payer: Self-pay

## 2023-10-01 LAB — SURGICAL PATHOLOGY

## 2023-10-01 MED ORDER — POLYETHYLENE GLYCOL 3350 17 G PO PACK: 17.0000 g | PACK | Freq: Every day | ORAL | Status: AC

## 2023-10-01 MED ORDER — NIFEDIPINE ER 30 MG PO TB24
30.0000 mg | ORAL_TABLET | Freq: Every day | ORAL | 0 refills | Status: AC
  Filled 2023-10-01: qty 30, 30d supply, fill #0

## 2023-10-01 MED ORDER — SENNA 8.6 MG PO TABS: 2.0000 | ORAL_TABLET | Freq: Every day | ORAL | Status: AC

## 2023-10-01 MED ORDER — SIMETHICONE 80 MG PO CHEW: 80.0000 mg | CHEWABLE_TABLET | Freq: Three times a day (TID) | ORAL | Status: AC

## 2023-10-01 MED ORDER — ACETAMINOPHEN 500 MG PO TABS: 1000.0000 mg | ORAL_TABLET | Freq: Four times a day (QID) | ORAL | Status: AC | PRN

## 2023-10-01 MED ORDER — LABETALOL HCL 200 MG PO TABS
200.0000 mg | ORAL_TABLET | Freq: Two times a day (BID) | ORAL | 0 refills | Status: AC
  Filled 2023-10-01: qty 60, 30d supply, fill #0

## 2023-10-01 MED ORDER — OXYCODONE HCL 5 MG PO TABS
5.0000 mg | ORAL_TABLET | ORAL | 0 refills | Status: AC | PRN
  Filled 2023-10-01: qty 20, 4d supply, fill #0

## 2023-10-01 MED ORDER — IBUPROFEN 600 MG PO TABS
600.0000 mg | ORAL_TABLET | Freq: Four times a day (QID) | ORAL | 1 refills | Status: AC | PRN
  Filled 2023-10-01: qty 30, 8d supply, fill #0

## 2023-10-01 NOTE — Progress Notes (Signed)
 Patient discharged. Discharge instructions given. Patient verbalizes understanding. Transported by staff.

## 2023-10-01 NOTE — Discharge Instructions (Addendum)
For blood pressure monitoring at home: Please check your blood pressure at home daily and write down what it is so that you can review it with your provider  Make sure you are able to sit for 15 minutes prior to taking it  If blood pressure 160/110 or greater repeat in 15 minutes.  If still 160/110 or greater come to the Emergency Department for evaluation.  If it goes down please contact the on call provider to check in.   Please go to the ED for severe range blood pressures (160/110 or higher), moderate to severe headache that is not relieved with Tylenol, chest pain, shortness of breath, changes in vision, or persistent pain in upper right abdomen.  Your medication may cause a mild headache or dizziness as you get used to it.  Please make sure that you are drinking plenty of water to help with this side effect.  Drinking 8-12 ounces of water with you medication, if you have a headache, and staying hydrated throughout the day can often help with side effects.     Cesarean Delivery, Care After Refer to this sheet in the next few weeks. These instructions provide you with information on caring for yourself after your procedure. Your health care provider may also give you specific instructions. Your treatment has been planned according to current medical practices, but problems sometimes occur. Call your health care provider if you have any problems or questions after you go home. HOME CARE INSTRUCTIONS  Please leave honey comb dressing (OP Site) on for 7 days.  You may shower during this period but turn your back to the water so that the dressing does not get directly saturated by the water.   You may take the dressing off on day 7.  The easiest way to do it is in the shower.  Allow the water to run over the dressing and it usually comes off easier.   Only take over-the-counter or prescription medications as directed by your health care provider. Do not drink alcohol, especially if you are  breastfeeding or taking medication to relieve pain. Do not  smoke tobacco. Continue to use good perineal care. Good perineal care includes: Wiping your perineum from front to back. Keeping your perineum clean. Check your surgical cut (incision) daily for increased redness, drainage, swelling, or separation of skin. Shower and clean your incision gently with soap and water every day, by letting warm and soapy water run over the incision, and then pat it dry. If your health care provider says it is okay, leave the incision uncovered. Use a bandage (dressing) if the incision is draining fluid or appears irritated. If the adhesive strips across the incision do not fall off within 7 days, carefully peel them off, after a shower. Hug a pillow when coughing or sneezing until your incision is healed. This helps to relieve pain. Do not use tampons, douches or have sexual intercourse, until your health care provider says it is okay. Wear a well-fitting bra that provides breast support. Limit wearing support panties or control-top hose. Drink enough fluids to keep your urine clear or pale yellow. Eat high-fiber foods such as whole grain cereals and breads, brown rice, beans, and fresh fruits and vegetables every day. These foods may help prevent or relieve constipation. Resume activities such as climbing stairs, driving, lifting, exercising, or traveling as directed by your health care provider. Try to have someone help you with your household activities and your newborn for at least a few   days after you leave the hospital. Rest as much as possible. Try to rest or take a nap when your newborn is sleeping. Increase your activities gradually. Do not lift more than 15lbs until directed by a provider. Keep all of your scheduled postpartum appointments. It is very important to keep your scheduled follow-up appointments. At these appointments, your health care provider will be checking to make sure that you are  healing physically and emotionally. SEEK MEDICAL CARE IF:  You are passing large clots from your vagina. Save any clots to show your health care provider. You have a foul smelling discharge from your vagina. You have trouble urinating. You are urinating frequently. You have pain when you urinate. You have a change in your bowel movements. You have increasing redness, pain, or swelling near your incision. You have pus draining from your incision. Your incision is separating. You have painful, hard, or reddened breasts. You have a severe headache. You have blurred vision or see spots. You feel sad or depressed. You have thoughts of hurting yourself or your newborn. You have questions about your care, the care of your newborn, or medications. You are dizzy or light-headed. You have a rash. You have pain, redness, or swelling at the site of the removed intravenous access (IV) tube. You have nausea or vomiting. You stopped breastfeeding and have not had a menstrual period within 12 weeks of stopping. You are not breastfeeding and have not had a menstrual period within 12 weeks of delivery. You have a fever. SEEK IMMEDIATE MEDICAL CARE IF: You have persistent pain. You have chest pain. You have shortness of breath. You faint. You have leg pain. You have stomach pain. Your vaginal bleeding saturates 2 or more sanitary pads in 1 hour. MAKE SURE YOU:  Understand these instructions. Will watch your condition. Will get help right away if you are not doing well or get worse. Document Released: 03/02/2002 Document Revised: 10/25/2013 Document Reviewed: 02/05/2012 Exit[employer] Patient Information 2015 [employer]. This information is not intended to replace advice given to you by your health care provider. Make sure you discuss any questions you have with your health care provider.  

## 2023-10-01 NOTE — Plan of Care (Signed)
  Problem: Education: Goal: Knowledge of disease or condition will improve Outcome: Progressing Goal: Knowledge of the prescribed therapeutic regimen will improve Outcome: Progressing   Problem: Fluid Volume: Goal: Peripheral tissue perfusion will improve Outcome: Progressing   Problem: Clinical Measurements: Goal: Complications related to disease process, condition or treatment will be avoided or minimized Outcome: Progressing   Problem: Education: Goal: Knowledge of General Education information will improve Description: Including pain rating scale, medication(s)/side effects and non-pharmacologic comfort measures Outcome: Progressing   Problem: Health Behavior/Discharge Planning: Goal: Ability to manage health-related needs will improve Outcome: Progressing   Problem: Clinical Measurements: Goal: Ability to maintain clinical measurements within normal limits will improve Outcome: Progressing Goal: Will remain free from infection Outcome: Progressing Goal: Diagnostic test results will improve Outcome: Progressing Goal: Respiratory complications will improve Outcome: Progressing Goal: Cardiovascular complication will be avoided Outcome: Progressing   Problem: Activity: Goal: Risk for activity intolerance will decrease Outcome: Progressing   Problem: Coping: Goal: Level of anxiety will decrease Outcome: Progressing   Problem: Elimination: Goal: Will not experience complications related to bowel motility Outcome: Progressing Goal: Will not experience complications related to urinary retention Outcome: Progressing   Problem: Pain Managment: Goal: General experience of comfort will improve and/or be controlled Outcome: Progressing   Problem: Education: Goal: Knowledge of condition will improve Outcome: Progressing   Problem: Activity: Goal: Will verbalize the importance of balancing activity with adequate rest periods Outcome: Progressing Goal: Ability to  tolerate increased activity will improve Outcome: Progressing   Problem: Coping: Goal: Ability to identify and utilize available resources and services will improve Outcome: Progressing   Problem: Life Cycle: Goal: Chance of risk for complications during the postpartum period will decrease Outcome: Progressing   Problem: Role Relationship: Goal: Ability to demonstrate positive interaction with newborn will improve Outcome: Progressing   Problem: Skin Integrity: Goal: Demonstration of wound healing without infection will improve Outcome: Progressing

## 2023-10-01 NOTE — Lactation Note (Signed)
 This note was copied from a baby's chart. Lactation Consultation Note  Patient Name: [name] [id number] Date: 10/01/2023 Age:28 hours Reason for consult: Follow-up assessment;Late-preterm 34-36.6wks;Other (Comment) (pumping for twins in SCN)   Maternal Data Mom is a [id number], repeat C/S. Mom with history of gestational HTN, preeclampsia, and anemia.  On follow-up visit today mom reports she pumped last night and obtained a small amount of colostrum which each baby was given some. Per mom she took her pain medicine last night and went to sleep.  Has patient been taught Hand Expression?: Yes Does the patient have breastfeeding experience prior to this delivery?: Yes How long did the patient breastfeed?: Mom pumped for 1st child  Feeding Mother's Current Feeding Choice: Breast Milk and Formula  Lactation Tools Discussed/Used Tools: Pump Breast pump type: Double-Electric Breast Pump (Mom has been preferring to use the manual pump. Reviewed with mom the physiology of how the body makes milk and the rationale for use of the double electric breastpump.) Pump Education: Setup, frequency, and cleaning Reason for Pumping: 34 week twins in SCN Pumping frequency: goal 8 times/24 hours Pumped volume:  (Mom reports she was able to express a small amount that was split betweenthe 2 babies.) Reviewed with mom how the body knows to make milk and the importance of pumping in the early post partum period to establish her milk supply. Discussed options to get to goal of 8 pump sessions in 24 hours including if she sleeps 1 longer stretch of 4-5 hours to add in additional pump sessions throughout the day/evening. Reviewed optimizing milk production with use of DEBP and immediately after use of DEBP mom could hand express or use her manual pump for a few minutes a couple times per day. Mom verbalized understanding and is in agreement with using a DEBP to pump. Mom will try to increase her consistency  with pumping. Support and encouragement provided. Interventions Interventions: Education;DEBP  Discharge Discharge Education: Engorgement and breast care;Outpatient recommendation Pump: Personal  Consult Status Consult Status: Follow-up Date: 10/02/23 Follow-up type: In-patient  Update provided to  [facility] care nurse.  Fuller Song 10/01/2023, 1:58 PM

## 2023-10-03 ENCOUNTER — Ambulatory Visit: Payer: Self-pay

## 2023-10-03 ENCOUNTER — Ambulatory Visit

## 2023-10-03 ENCOUNTER — Other Ambulatory Visit

## 2023-10-03 NOTE — Lactation Note (Signed)
 This note was copied from a baby's chart. Lactation Consultation Note  Patient Name: [name] Today's Date: 10/03/2023 Age:28 days Reason for consult: Weekly NICU follow-up;Late-preterm 34-36.6wks   Maternal Data LC met w/ the twins mom at bedside.  Mom feeding one of the twins at the time of the visit.  MOB stated that she was very full and needed to pump.  LC set up the DEBP at her beside.  During this time mom stated that she only pumps twice a day  Feeding Mother's Current Feeding Choice: Breast Milk and Formula Nipple Type: (P) Dr. Levert Feinstein Preemie  Lactation Tools Discussed/Used Tools: (P) Bottle;63F feeding tube / Syringe Breast pump type: Double-Electric Breast Pump Pump Education: Setup, frequency, and cleaning Reason for Pumping: Infants in NICU Pumping frequency: 8x/win a 24hr period Pumped volume: 84 mL  LC sized mom down to a 18mm flange, the 21mm flange was too big and moving a lot of her breast tissue.   Interventions Interventions: Education;CDC Guidelines for Breast Pump Cleaning  LC reviewed the importance of consistent pumping at least every 3hrs and stretching out sleep for at least 4hrs.  Mom verbalized understanding.   Consult Status Consult Status: NICU follow-up Follow-up type: Call as needed    Rhys Martini 10/03/2023, 5:36 PM

## 2023-10-23 ENCOUNTER — Other Ambulatory Visit: Payer: Self-pay

## 2023-12-11 ENCOUNTER — Other Ambulatory Visit

## 2023-12-16 ENCOUNTER — Other Ambulatory Visit

## 2023-12-18 ENCOUNTER — Other Ambulatory Visit

## 2024-05-03 ENCOUNTER — Ambulatory Visit (LOCAL_COMMUNITY_HEALTH_CENTER): Payer: Self-pay

## 2024-05-03 DIAGNOSIS — Z111 Encounter for screening for respiratory tuberculosis: Secondary | ICD-10-CM

## 2024-05-05 ENCOUNTER — Other Ambulatory Visit

## 2024-06-14 ENCOUNTER — Ambulatory Visit: Payer: No Typology Code available for payment source

## 2024-07-13 ENCOUNTER — Other Ambulatory Visit

## 2024-07-15 ENCOUNTER — Other Ambulatory Visit

## 2024-07-16 ENCOUNTER — Other Ambulatory Visit

## 2024-07-19 ENCOUNTER — Other Ambulatory Visit

## 2024-07-23 ENCOUNTER — Other Ambulatory Visit

## 2024-07-26 ENCOUNTER — Other Ambulatory Visit

## 2024-07-27 ENCOUNTER — Other Ambulatory Visit

## 2024-07-30 ENCOUNTER — Other Ambulatory Visit

## 2024-08-16 ENCOUNTER — Other Ambulatory Visit

## 2024-08-18 ENCOUNTER — Other Ambulatory Visit
